# Patient Record
Sex: Female | Born: 1950 | Race: Black or African American | Hispanic: No | Marital: Married | Smoking: Current every day smoker
Health system: Southern US, Community
[De-identification: ages and names within clinical notes are randomized; demographics above are authoritative.]

## PROBLEM LIST (undated history)

## (undated) DIAGNOSIS — I4891 Unspecified atrial fibrillation: Secondary | ICD-10-CM

## (undated) DIAGNOSIS — I739 Peripheral vascular disease, unspecified: Secondary | ICD-10-CM

## (undated) DIAGNOSIS — I10 Essential (primary) hypertension: Secondary | ICD-10-CM

## (undated) HISTORY — PX: CARDIAC ELECTROPHYSIOLOGY STUDY AND ABLATION: SHX1294

## (undated) HISTORY — PX: REPLACEMENT TOTAL KNEE BILATERAL: SUR1225

## (undated) HISTORY — PX: ABDOMINAL HYSTERECTOMY: SHX81

---

## 2012-12-27 ENCOUNTER — Ambulatory Visit: Payer: Self-pay | Admitting: Family Medicine

## 2013-06-16 ENCOUNTER — Ambulatory Visit: Payer: Self-pay | Admitting: Pain Medicine

## 2013-07-04 ENCOUNTER — Ambulatory Visit: Payer: Self-pay | Admitting: Pain Medicine

## 2013-08-02 ENCOUNTER — Ambulatory Visit: Payer: Self-pay | Admitting: Pain Medicine

## 2013-08-22 ENCOUNTER — Ambulatory Visit: Payer: Self-pay | Admitting: Pain Medicine

## 2013-08-29 ENCOUNTER — Ambulatory Visit: Payer: Self-pay | Admitting: Pain Medicine

## 2013-09-26 ENCOUNTER — Ambulatory Visit: Payer: Self-pay | Admitting: Pain Medicine

## 2014-03-05 ENCOUNTER — Ambulatory Visit: Payer: Self-pay

## 2014-03-13 ENCOUNTER — Ambulatory Visit: Payer: Self-pay | Admitting: Family Medicine

## 2014-06-20 ENCOUNTER — Ambulatory Visit: Payer: Self-pay | Admitting: Family Medicine

## 2014-09-09 ENCOUNTER — Encounter: Payer: Self-pay | Admitting: Emergency Medicine

## 2014-09-09 ENCOUNTER — Emergency Department: Payer: Self-pay | Attending: Emergency Medicine

## 2014-09-09 ENCOUNTER — Inpatient Hospital Stay
Admission: EM | Admit: 2014-09-09 | Discharge: 2014-09-13 | DRG: 981 | Payer: Self-pay | Attending: Internal Medicine | Admitting: Internal Medicine

## 2014-09-09 DIAGNOSIS — Z01818 Encounter for other preprocedural examination: Secondary | ICD-10-CM

## 2014-09-09 DIAGNOSIS — R579 Shock, unspecified: Secondary | ICD-10-CM | POA: Diagnosis present

## 2014-09-09 DIAGNOSIS — I8501 Esophageal varices with bleeding: Secondary | ICD-10-CM | POA: Diagnosis present

## 2014-09-09 DIAGNOSIS — E669 Obesity, unspecified: Secondary | ICD-10-CM | POA: Diagnosis present

## 2014-09-09 DIAGNOSIS — K7682 Hepatic encephalopathy: Secondary | ICD-10-CM

## 2014-09-09 DIAGNOSIS — Z7951 Long term (current) use of inhaled steroids: Secondary | ICD-10-CM

## 2014-09-09 DIAGNOSIS — Z90710 Acquired absence of both cervix and uterus: Secondary | ICD-10-CM

## 2014-09-09 DIAGNOSIS — F17210 Nicotine dependence, cigarettes, uncomplicated: Secondary | ICD-10-CM | POA: Diagnosis present

## 2014-09-09 DIAGNOSIS — D649 Anemia, unspecified: Secondary | ICD-10-CM

## 2014-09-09 DIAGNOSIS — Z452 Encounter for adjustment and management of vascular access device: Secondary | ICD-10-CM

## 2014-09-09 DIAGNOSIS — R569 Unspecified convulsions: Secondary | ICD-10-CM

## 2014-09-09 DIAGNOSIS — Z885 Allergy status to narcotic agent status: Secondary | ICD-10-CM

## 2014-09-09 DIAGNOSIS — J9600 Acute respiratory failure, unspecified whether with hypoxia or hypercapnia: Secondary | ICD-10-CM

## 2014-09-09 DIAGNOSIS — Z7982 Long term (current) use of aspirin: Secondary | ICD-10-CM

## 2014-09-09 DIAGNOSIS — E7220 Disorder of urea cycle metabolism, unspecified: Secondary | ICD-10-CM | POA: Diagnosis present

## 2014-09-09 DIAGNOSIS — N170 Acute kidney failure with tubular necrosis: Secondary | ICD-10-CM | POA: Diagnosis present

## 2014-09-09 DIAGNOSIS — N179 Acute kidney failure, unspecified: Secondary | ICD-10-CM

## 2014-09-09 DIAGNOSIS — K7200 Acute and subacute hepatic failure without coma: Principal | ICD-10-CM | POA: Diagnosis present

## 2014-09-09 DIAGNOSIS — K922 Gastrointestinal hemorrhage, unspecified: Secondary | ICD-10-CM

## 2014-09-09 DIAGNOSIS — B1920 Unspecified viral hepatitis C without hepatic coma: Secondary | ICD-10-CM

## 2014-09-09 DIAGNOSIS — K7460 Unspecified cirrhosis of liver: Secondary | ICD-10-CM

## 2014-09-09 DIAGNOSIS — G40901 Epilepsy, unspecified, not intractable, with status epilepticus: Secondary | ICD-10-CM

## 2014-09-09 DIAGNOSIS — Z809 Family history of malignant neoplasm, unspecified: Secondary | ICD-10-CM

## 2014-09-09 DIAGNOSIS — I739 Peripheral vascular disease, unspecified: Secondary | ICD-10-CM | POA: Diagnosis present

## 2014-09-09 DIAGNOSIS — K767 Hepatorenal syndrome: Secondary | ICD-10-CM | POA: Diagnosis present

## 2014-09-09 DIAGNOSIS — Z79899 Other long term (current) drug therapy: Secondary | ICD-10-CM

## 2014-09-09 DIAGNOSIS — E875 Hyperkalemia: Secondary | ICD-10-CM | POA: Diagnosis present

## 2014-09-09 DIAGNOSIS — K920 Hematemesis: Secondary | ICD-10-CM | POA: Diagnosis present

## 2014-09-09 DIAGNOSIS — K7290 Hepatic failure, unspecified without coma: Secondary | ICD-10-CM

## 2014-09-09 DIAGNOSIS — Z96653 Presence of artificial knee joint, bilateral: Secondary | ICD-10-CM | POA: Diagnosis present

## 2014-09-09 DIAGNOSIS — K5900 Constipation, unspecified: Secondary | ICD-10-CM

## 2014-09-09 DIAGNOSIS — Z8249 Family history of ischemic heart disease and other diseases of the circulatory system: Secondary | ICD-10-CM

## 2014-09-09 DIAGNOSIS — I482 Chronic atrial fibrillation: Secondary | ICD-10-CM | POA: Diagnosis present

## 2014-09-09 HISTORY — DX: Essential (primary) hypertension: I10

## 2014-09-09 HISTORY — DX: Unspecified atrial fibrillation: I48.91

## 2014-09-09 HISTORY — DX: Peripheral vascular disease, unspecified: I73.9

## 2014-09-09 MED ORDER — SODIUM CHLORIDE 0.9 % IV SOLN
8.0000 mg | Freq: Once | INTRAVENOUS | Status: AC
  Administered 2014-09-09: 8 mg via INTRAVENOUS
  Filled 2014-09-09: qty 4

## 2014-09-09 MED ORDER — METOCLOPRAMIDE HCL 5 MG/ML IJ SOLN
20.0000 mg | Freq: Once | INTRAVENOUS | Status: AC
  Administered 2014-09-10: 20 mg via INTRAVENOUS
  Filled 2014-09-09: qty 4

## 2014-09-09 MED ORDER — OCTREOTIDE ACETATE 100 MCG/ML IJ SOLN
100.0000 ug | Freq: Once | INTRAMUSCULAR | Status: AC
  Administered 2014-09-09: 100 ug via INTRAVENOUS
  Filled 2014-09-09: qty 1

## 2014-09-09 MED ORDER — SODIUM CHLORIDE 0.9 % IV SOLN
80.0000 mg | Freq: Once | INTRAVENOUS | Status: AC
  Administered 2014-09-09: 80 mg via INTRAVENOUS
  Filled 2014-09-09: qty 80

## 2014-09-09 MED ORDER — SODIUM CHLORIDE 0.9 % IV BOLUS (SEPSIS)
2000.0000 mL | Freq: Once | INTRAVENOUS | Status: AC
  Administered 2014-09-09: 2000 mL via INTRAVENOUS

## 2014-09-09 NOTE — ED Notes (Signed)
Pt actively vomiting up dark red blood. MD aware.

## 2014-09-09 NOTE — ED Notes (Signed)
Pending results per lab.

## 2014-09-09 NOTE — ED Notes (Signed)
Pt arrived via EMS from home. Per EMS pt coughed up bright red blood 50-73ml. Pt appears diaphoretic and weak upon arrival;alert and oriented. Pt just returned from a cruise to the [place].

## 2014-09-09 NOTE — ED Provider Notes (Signed)
[facility] Emergency Department Provider Note  ____________________________________________  Time seen: 9:15 PM  I have reviewed the triage vital signs and the nursing notes.   HISTORY  Chief Complaint Hemoptysis and Weakness    HPI Robin Schneider is a 64 y.o. female who reports sudden onset of epigastric pain and hematemesis this afternoon. She recently returned from a cruise in the [place]. She denies prior GI bleeds or peptic ulcers. She has been taking some ibuprofen but not excessive or frequent doses. No syncope chest pain shortness of breath. No diarrhea or melena. No trauma, no anticoagulant use     Past Medical History  Diagnosis Date  . Hypertension     There are no active problems to display for this patient.   Past Surgical History  Procedure Laterality Date  . Replacement total knee bilateral    . Cardiac electrophysiology study and ablation      No current outpatient prescriptions on file.  Allergies Morphine and related and Percocet  History reviewed. No pertinent family history.  Social History History  Substance Use Topics  . Smoking status: Current Every Day Smoker -- 0.50 packs/day    Types: Cigarettes  . Smokeless tobacco: Not on file  . Alcohol Use: No    Review of Systems  Constitutional: No fever or chills. No weight changes Eyes:No blurry vision or double vision.  ENT: No sore throat. Cardiovascular: No chest pain. Respiratory: No dyspnea or cough. Gastrointestinal: Epigastric abdominal pain with hematemesis.  No BRBPR or melena. Genitourinary: Negative for dysuria, urinary retention, bloody urine, or difficulty urinating. Musculoskeletal: Negative for back pain. No joint swelling or pain. Skin: Negative for rash. Neurological: Negative for headaches, focal weakness or numbness. Psychiatric:No anxiety or depression.   Endocrine:No hot/cold intolerance, changes in energy, or sleep  difficulty.  10-point ROS otherwise negative.  ____________________________________________   PHYSICAL EXAM:  VITAL SIGNS: ED Triage Vitals  Enc Vitals Group     BP 09/09/14 2119 92/48 mmHg     Pulse Rate 09/09/14 2119 96     Resp 09/09/14 2119 24     Temp 09/09/14 2119 97.4 F (36.3 C)     Temp Source 09/09/14 2119 Oral     SpO2 09/09/14 2119 99 %     Weight --      Height 09/09/14 2119 5\' 6"  (1.676 m)     Head Cir --      Peak Flow --      Pain Score --      Pain Loc --      Pain Edu? --      Excl. in GC? --      Constitutional: Alert and oriented. Moderate distress Eyes: No scleral icterus. No conjunctival pallor. PERRL. EOMI ENT   Head: Normocephalic and atraumatic.   Nose: No congestion/rhinnorhea. No septal hematoma   Mouth/Throat: MMM, no pharyngeal erythema. No peritonsillar mass. No uvula shift. Dried blood on lips   Neck: No stridor. No SubQ emphysema. No meningismus. Hematological/Lymphatic/Immunilogical: No cervical lymphadenopathy. Cardiovascular: RRR. Normal and symmetric distal pulses are present in all extremities. No murmurs, rubs, or gallops. Respiratory: Normal respiratory effort without tachypnea nor retractions. Breath sounds are clear and equal bilaterally. No wheezes/rales/rhonchi. Gastrointestinal: Soft with epigastric and left upper quadrant tenderness. No distention. There is no CVA tenderness.  No rebound, rigidity, or guarding. Genitourinary: deferred Musculoskeletal: Nontender with normal range of motion in all extremities. No joint effusions.  No lower extremity tenderness.  No edema. Neurologic:  Normal speech and language.  CN 2-10 normal. Motor grossly intact. No pronator drift.  Normal gait. No gross focal neurologic deficits are appreciated.  Skin:  Skin is warm, dry and intact. No rash noted.  No petechiae, purpura, or bullae. Psychiatric: Mood and affect are normal. Speech and behavior are normal. Patient exhibits  appropriate insight and judgment.  ____________________________________________    LABS (pertinent positives/negatives) (all labs ordered are listed, but only abnormal results are displayed) Labs Reviewed  CBC  BASIC METABOLIC PANEL  HEPATIC FUNCTION PANEL  LIPASE, BLOOD  PROTIME-INR  CBG MONITORING, ED  TYPE AND SCREEN  ABO/RH   ____________________________________________   EKG  Interpreted by me Junctional rhythm rate 89, normal axis and intervals, normal QRS, normal ST segments and T waves.  ____________________________________________    RADIOLOGY  Chest x-ray unremarkable  ____________________________________________   PROCEDURES CRITICAL CARE Performed by: Scotty Court, Bhargav Barbaro   Total critical care time: 35 minutes  Critical care time was exclusive of separately billable procedures and treating other patients.  Critical care was necessary to treat or prevent imminent or life-threatening deterioration.  Critical care was time spent personally by me on the following activities: development of treatment plan with patient and/or surrogate as well as nursing, discussions with consultants, evaluation of patient's response to treatment, examination of patient, obtaining history from patient or surrogate, ordering and performing treatments and interventions, ordering and review of laboratory studies, ordering and review of radiographic studies, pulse oximetry and re-evaluation of patient's condition.  ____________________________________________   INITIAL IMPRESSION / ASSESSMENT AND PLAN / ED COURSE  Pertinent labs & imaging results that were available during my care of the patient were reviewed by me and considered in my medical decision making (see chart for details).  Patient presents with acute upper GI hemorrhage. We'll give 2 L IV saline for resuscitation as patient presented borderline hypotensive with initial blood pressure 90/50. Will start Protonix bolus  plus drip, octreotide despite absence of liver disease history because of the severity of her presentation, Zofran.  ----------------------------------------- 12:03 AM on 09/10/2014 -----------------------------------------  Still waiting on lab results despite multiple calls to the lab. No explanation for the delay. I discussed the patient's case with GI doctor oh who is no further recommendations at this time and will consult on the patient and requests admission to hospitalist services. I then discussed the case with hospitalist Dr. Sheryle Hail at 12:00 AM who will evaluate the patient for admission. With IV fluids, blood pressure has been stable at 100/65. Heart rate of 90.    ____________________________________________   FINAL CLINICAL IMPRESSION(S) / ED DIAGNOSES  Final diagnoses:  Acute upper GI hemorrhage      Sharman Cheek, MD 09/10/14 0003

## 2014-09-10 ENCOUNTER — Inpatient Hospital Stay: Payer: Self-pay | Admitting: Anesthesiology

## 2014-09-10 ENCOUNTER — Encounter: Payer: Self-pay | Admitting: Internal Medicine

## 2014-09-10 ENCOUNTER — Encounter
Admission: EM | Disposition: A | Discharge status: Other Acute Inpatient Hospital | Payer: Self-pay | Source: Home / Self Care | Attending: Internal Medicine

## 2014-09-10 DIAGNOSIS — K920 Hematemesis: Secondary | ICD-10-CM | POA: Diagnosis present

## 2014-09-10 HISTORY — PX: ESOPHAGEAL BANDING: SHX5518

## 2014-09-10 HISTORY — PX: ESOPHAGOGASTRODUODENOSCOPY: SHX5428

## 2014-09-10 LAB — HEPATIC FUNCTION PANEL
ALK PHOS: 105 U/L (ref 38–126)
ALT: 16 U/L (ref 14–54)
AST: 40 U/L (ref 15–41)
Albumin: 3 g/dL — ABNORMAL LOW (ref 3.5–5.0)
BILIRUBIN TOTAL: 1.1 mg/dL (ref 0.3–1.2)
Bilirubin, Direct: 0.3 mg/dL (ref 0.1–0.5)
Indirect Bilirubin: 0.8 mg/dL (ref 0.3–0.9)
Total Protein: 5.2 g/dL — ABNORMAL LOW (ref 6.5–8.1)

## 2014-09-10 LAB — HEMOGLOBIN: HEMOGLOBIN: 9.6 g/dL — AB (ref 12.0–16.0)

## 2014-09-10 LAB — BASIC METABOLIC PANEL
ANION GAP: 4 — AB (ref 5–15)
BUN: 26 mg/dL — AB (ref 6–20)
CO2: 21 mmol/L — AB (ref 22–32)
CREATININE: 0.98 mg/dL (ref 0.44–1.00)
Calcium: 7.9 mg/dL — ABNORMAL LOW (ref 8.9–10.3)
Chloride: 110 mmol/L (ref 101–111)
GFR calc Af Amer: >60 mL/min (ref >60)
GFR calc non Af Amer: >60 mL/min (ref >60)
Glucose, Bld: 203 mg/dL — ABNORMAL HIGH (ref 65–99)
Potassium: 5.5 mmol/L — ABNORMAL HIGH (ref 3.5–5.1)
Sodium: 135 mmol/L (ref 135–145)

## 2014-09-10 LAB — CBC
HCT: 32.4 % — ABNORMAL LOW (ref 35.0–47.0)
Hemoglobin: 10.1 g/dL — ABNORMAL LOW (ref 12.0–16.0)
MCH: 23.9 pg — AB (ref 26.0–34.0)
MCHC: 31.2 g/dL — ABNORMAL LOW (ref 32.0–36.0)
MCV: 76.4 fL — ABNORMAL LOW (ref 80.0–100.0)
Platelets: 120 10*3/uL — ABNORMAL LOW (ref 150–440)
RBC: 4.24 MIL/uL (ref 3.80–5.20)
RDW: 16.9 % — ABNORMAL HIGH (ref 11.5–14.5)
WBC: 7.8 10*3/uL (ref 3.6–11.0)

## 2014-09-10 LAB — MRSA PCR SCREENING: MRSA by PCR: NEGATIVE

## 2014-09-10 LAB — PROTIME-INR
INR: 1.64
PROTHROMBIN TIME: 19.6 s — AB (ref 11.4–15.0)

## 2014-09-10 LAB — TYPE AND SCREEN
ABO/RH(D): A POS
Antibody Screen: POSITIVE

## 2014-09-10 LAB — TSH: TSH: 1.101 u[IU]/mL (ref 0.350–4.500)

## 2014-09-10 LAB — LACTIC ACID, PLASMA
Lactic Acid, Venous: 1.6 mmol/L (ref 0.5–2.0)
Lactic Acid, Venous: 1.9 mmol/L (ref 0.5–2.0)

## 2014-09-10 LAB — ABO/RH: ABO/RH(D): A POS

## 2014-09-10 LAB — LIPASE, BLOOD: Lipase: 34 U/L (ref 22–51)

## 2014-09-10 LAB — GLUCOSE, CAPILLARY: Glucose-Capillary: 221 mg/dL — ABNORMAL HIGH (ref 65–99)

## 2014-09-10 LAB — HEMOGLOBIN A1C: Hgb A1c MFr Bld: 5.6 % (ref 4.0–6.0)

## 2014-09-10 SURGERY — EGD (ESOPHAGOGASTRODUODENOSCOPY)
Anesthesia: General

## 2014-09-10 SURGERY — EGD (ESOPHAGOGASTRODUODENOSCOPY)
Anesthesia: General | Laterality: Left

## 2014-09-10 MED ORDER — PREGABALIN 75 MG PO CAPS: 75.0000 mg | ORAL_CAPSULE | Freq: Two times a day (BID) | ORAL | Status: DC

## 2014-09-10 MED ORDER — SODIUM CHLORIDE 0.9 % IV SOLN
25.0000 ug/h | INTRAVENOUS | Status: DC
  Administered 2014-09-10 (×2): 50 ug/h via INTRAVENOUS
  Administered 2014-09-11 – 2014-09-13 (×2): 25 ug/h via INTRAVENOUS
  Filled 2014-09-10 (×14): qty 1

## 2014-09-10 MED ORDER — FENTANYL CITRATE (PF) 100 MCG/2ML IJ SOLN
INTRAMUSCULAR | Status: DC | PRN
  Administered 2014-09-10: 50 ug via INTRAVENOUS

## 2014-09-10 MED ORDER — DIAZEPAM 2 MG PO TABS: 5.0000 mg | ORAL_TABLET | Freq: Two times a day (BID) | ORAL | Status: DC | PRN

## 2014-09-10 MED ORDER — ALBUTEROL SULFATE (2.5 MG/3ML) 0.083% IN NEBU: 3.0000 mL | INHALATION_SOLUTION | RESPIRATORY_TRACT | Status: DC | PRN

## 2014-09-10 MED ORDER — PHENYLEPHRINE HCL 10 MG/ML IJ SOLN
INTRAMUSCULAR | Status: DC | PRN
  Administered 2014-09-10: 300 ug via INTRAVENOUS
  Administered 2014-09-10: 200 ug via INTRAVENOUS
  Administered 2014-09-10: 100 ug via INTRAVENOUS
  Administered 2014-09-10 (×2): 200 ug via INTRAVENOUS

## 2014-09-10 MED ORDER — ONDANSETRON HCL 4 MG PO TABS: 4.0000 mg | ORAL_TABLET | Freq: Four times a day (QID) | ORAL | Status: DC | PRN

## 2014-09-10 MED ORDER — LIDOCAINE HCL (PF) 2 % IJ SOLN
INTRAMUSCULAR | Status: DC | PRN
  Administered 2014-09-10: 2 mL via INTRADERMAL

## 2014-09-10 MED ORDER — DIAZEPAM 5 MG/ML IJ SOLN
2.5000 mg | Freq: Once | INTRAMUSCULAR | Status: AC
  Administered 2014-09-10: 2.5 mg via INTRAVENOUS

## 2014-09-10 MED ORDER — ALBUTEROL SULFATE 1.25 MG/3ML IN NEBU: 3.0000 mL | INHALATION_SOLUTION | Freq: Four times a day (QID) | RESPIRATORY_TRACT | Status: DC | PRN

## 2014-09-10 MED ORDER — ONDANSETRON HCL 4 MG/2ML IJ SOLN
4.0000 mg | Freq: Four times a day (QID) | INTRAMUSCULAR | Status: AC
  Administered 2014-09-10 – 2014-09-11 (×2): 4 mg via INTRAVENOUS
  Filled 2014-09-10 (×2): qty 2

## 2014-09-10 MED ORDER — PROPOFOL INFUSION 10 MG/ML OPTIME
INTRAVENOUS | Status: DC | PRN
  Administered 2014-09-10: 120 ug/kg/min via INTRAVENOUS

## 2014-09-10 MED ORDER — SODIUM CHLORIDE 0.9 % IV SOLN: INTRAVENOUS | Status: DC

## 2014-09-10 MED ORDER — SODIUM CHLORIDE 0.9 % IJ SOLN
3.0000 mL | Freq: Two times a day (BID) | INTRAMUSCULAR | Status: DC
  Administered 2014-09-10 – 2014-09-12 (×4): 3 mL via INTRAVENOUS

## 2014-09-10 MED ORDER — DIAZEPAM 5 MG/ML IJ SOLN
INTRAMUSCULAR | Status: AC
Start: 2014-09-10 — End: 2014-09-10
  Administered 2014-09-10: 2.5 mg via INTRAVENOUS
  Filled 2014-09-10: qty 2

## 2014-09-10 MED ORDER — HEPARIN SODIUM (PORCINE) 5000 UNIT/ML IJ SOLN
5000.0000 [IU] | Freq: Three times a day (TID) | INTRAMUSCULAR | Status: DC
  Administered 2014-09-10: 5000 [IU] via SUBCUTANEOUS
  Filled 2014-09-10: qty 1

## 2014-09-10 MED ORDER — EPHEDRINE SULFATE 50 MG/ML IJ SOLN
INTRAMUSCULAR | Status: DC | PRN
  Administered 2014-09-10 (×3): 15 mg via INTRAVENOUS
  Administered 2014-09-10: 5 mg via INTRAVENOUS

## 2014-09-10 MED ORDER — OLOPATADINE HCL 0.1 % OP SOLN
1.0000 [drp] | Freq: Two times a day (BID) | OPHTHALMIC | Status: DC
  Administered 2014-09-10 – 2014-09-13 (×6): 1 [drp] via OPHTHALMIC
  Filled 2014-09-10 (×2): qty 5

## 2014-09-10 MED ORDER — PROPOFOL 10 MG/ML IV BOLUS
INTRAVENOUS | Status: DC | PRN
  Administered 2014-09-10: 20 mg via INTRAVENOUS

## 2014-09-10 MED ORDER — SODIUM CHLORIDE 0.9 % IV SOLN
INTRAVENOUS | Status: DC | PRN
  Administered 2014-09-10: 09:00:00 via INTRAVENOUS

## 2014-09-10 MED ORDER — LACTATED RINGERS IV SOLN
INTRAVENOUS | Status: DC | PRN
  Administered 2014-09-10: 09:00:00 via INTRAVENOUS

## 2014-09-10 MED ORDER — NORTRIPTYLINE HCL 25 MG PO CAPS: 25.0000 mg | ORAL_CAPSULE | Freq: Every day | ORAL | Status: DC

## 2014-09-10 MED ORDER — SPIRONOLACTONE 25 MG PO TABS: 50.0000 mg | ORAL_TABLET | Freq: Every day | ORAL | Status: DC

## 2014-09-10 MED ORDER — ONDANSETRON HCL 4 MG/2ML IJ SOLN
4.0000 mg | Freq: Four times a day (QID) | INTRAMUSCULAR | Status: DC | PRN
  Administered 2014-09-10: 4 mg via INTRAVENOUS
  Filled 2014-09-10 (×2): qty 2

## 2014-09-10 MED ORDER — SODIUM CHLORIDE 0.9 % IV SOLN
8.0000 mg/h | INTRAVENOUS | Status: DC
  Administered 2014-09-10: 8 mg/h via INTRAVENOUS
  Filled 2014-09-10: qty 80

## 2014-09-10 MED ORDER — PANTOPRAZOLE SODIUM 40 MG IV SOLR
40.0000 mg | Freq: Two times a day (BID) | INTRAVENOUS | Status: DC
  Administered 2014-09-10 – 2014-09-13 (×7): 40 mg via INTRAVENOUS
  Filled 2014-09-10 (×8): qty 40

## 2014-09-10 MED ORDER — SODIUM CHLORIDE 0.9 % IV BOLUS (SEPSIS)
250.0000 mL | Freq: Once | INTRAVENOUS | Status: AC
Start: 2014-09-10 — End: 2014-09-10
  Administered 2014-09-10: 250 mL via INTRAVENOUS

## 2014-09-10 MED ORDER — SODIUM CHLORIDE 0.9 % IV SOLN
INTRAVENOUS | Status: DC
  Administered 2014-09-10: 1000 mL via INTRAVENOUS
  Administered 2014-09-10: 125 mL/h via INTRAVENOUS
  Administered 2014-09-10 – 2014-09-11 (×2): via INTRAVENOUS

## 2014-09-10 MED ORDER — ASPIRIN EC 81 MG PO TBEC: 81.0000 mg | DELAYED_RELEASE_TABLET | Freq: Every day | ORAL | Status: DC

## 2014-09-10 MED ORDER — MIDAZOLAM HCL 5 MG/5ML IJ SOLN
INTRAMUSCULAR | Status: DC | PRN
  Administered 2014-09-10: 1 mg via INTRAVENOUS

## 2014-09-10 MED ORDER — NADOLOL 20 MG PO TABS
20.0000 mg | ORAL_TABLET | Freq: Every day | ORAL | Status: DC
  Filled 2014-09-10: qty 1

## 2014-09-10 MED ORDER — DIAZEPAM 5 MG PO TABS: 10.0000 mg | ORAL_TABLET | Freq: Two times a day (BID) | ORAL | Status: DC | PRN

## 2014-09-10 NOTE — Consult Note (Signed)
GI Inpatient Consult Note  Reason for Consult: Hemetemesis   Attending Requesting Consult:  History of Present Illness: Robin Schneider is a 64 y.o. female who felt sick last night and ended up vomiting dark blood multiple times. Was initially hypotensive but with IVF, SBP more stable this AM. Some epigastric pain/nausea but feels better this AM. No prior problems with stomach. No GERD hx. On daily b ASA for chronic Afib. Also, takes naproxen prn. On protonix and octreotide drip for now. INR mildly elevated.  Past Medical History:  Past Medical History  Diagnosis Date  . Hypertension   . A-fib   . PAD (peripheral artery disease)     Problem List: Patient Active Problem List   Diagnosis Date Noted  . Hematemesis 09/10/2014    Past Surgical History: Past Surgical History  Procedure Laterality Date  . Replacement total knee bilateral    . Cardiac electrophysiology study and ablation      Alblation  . Abdominal hysterectomy      Allergies: Allergies  Allergen Reactions  . Morphine And Related Rash  . Percocet [Oxycodone-Acetaminophen] Rash    Home Medications: Prescriptions prior to admission  Medication Sig Dispense Refill Last Dose  . albuterol (ACCUNEB) 1.25 MG/3ML nebulizer solution Inhale 3 mLs into the lungs every 6 (six) hours as needed for wheezing or shortness of breath.    Past Week at Unknown time  . albuterol (VENTOLIN HFA) 108 (90 BASE) MCG/ACT inhaler Inhale 2 puffs into the lungs every 4 (four) hours as needed for wheezing or shortness of breath.    Past Week at Unknown time  . aspirin EC 81 MG tablet Take 1 tablet by mouth daily.    09/10/2014 at Unknown time  . diazepam (VALIUM) 5 MG tablet Take 10 mg by mouth every 12 (twelve) hours as needed for anxiety.    unknown at unknown  . diclofenac sodium (VOLTAREN) 1 % GEL Apply 2 g topically 2 (two) times daily.   09/10/2014 at Unknown time  . nortriptyline (PAMELOR) 25 MG capsule Take 25 mg by mouth at  bedtime.   unknown at Unknown time  . olopatadine (PATANOL) 0.1 % ophthalmic solution Apply 1 drop to eye 2 (two) times daily.   09/10/2014 at Unknown time  . pregabalin (LYRICA) 75 MG capsule Take 1 capsule by mouth 2 (two) times daily.   09/10/2014 at Unknown time  . spironolactone (ALDACTONE) 50 MG tablet Take 1 tablet by mouth daily at 12 noon.   09/10/2014   Home medication reconciliation was completed with the patient.   Scheduled Inpatient Medications:   . heparin  5,000 Units Subcutaneous 3 times per day  . nortriptyline  25 mg Oral QHS  . olopatadine  1 drop Both Eyes BID  . pregabalin  75 mg Oral BID  . sodium chloride  3 mL Intravenous Q12H    Continuous Inpatient Infusions:   . sodium chloride 125 mL/hr (09/10/14 0409)  . pantoprozole (PROTONIX) infusion 8 mg/hr (09/10/14 0300)    PRN Inpatient Medications:  albuterol, diazepam, ondansetron **OR** ondansetron (ZOFRAN) IV  Family History: family history includes Cancer in an other family member; Hypertension in an other family member.  The patient's family history is negative for inflammatory bowel disorders, GI malignancy, or solid organ transplantation.  Social History:   reports that she has been smoking Cigarettes.  She has a 24 pack-year smoking history. She does not have any smokeless tobacco history on file. She reports that she does not  drink alcohol or use illicit drugs. The patient denies ETOH, tobacco, or drug use.   Review of Systems: Constitutional: Weight is stable.  Eyes: No changes in vision. ENT: No oral lesions, sore throat.  GI: see HPI.  Heme/Lymph: No easy bruising.  CV: No chest pain.  GU: No hematuria.  Integumentary: No rashes.  Neuro: No headaches.  Psych: No depression/anxiety.  Endocrine: No heat/cold intolerance.  Allergic/Immunologic: No urticaria.  Resp: No cough, SOB.  Musculoskeletal: No joint swelling.    Physical Examination: BP 102/62 mmHg  Pulse 81  Temp(Src) 98.3 F (36.8  C) (Oral)  Resp 13  Ht 5\' 7"  (1.702 m)  Wt 81.7 kg (180 lb 1.9 oz)  BMI 28.20 kg/m2  SpO2 94% Gen: NAD, alert and oriented x 4 HEENT: PEERLA, EOMI, Neck: supple, no JVD or thyromegaly Chest: CTA bilaterally, no wheezes, crackles, or other adventitious sounds CV: RRR, no m/g/c/r Abd: soft, mild epigastric tenderness, ND, +BS in all four quadrants; no HSM, guarding, ridigity, or rebound tenderness Ext: no edema, well perfused with 2+ pulses, Skin: no rash or lesions noted Lymph: no LAD  Data: Lab Results  Component Value Date   WBC 7.8 09/09/2014   HGB 10.1* 09/09/2014   HCT 32.4* 09/09/2014   MCV 76.4* 09/09/2014   PLT 120* 09/09/2014    Recent Labs Lab 09/09/14 2201  HGB 10.1*   Lab Results  Component Value Date   NA 135 09/09/2014   K 5.5* 09/09/2014   CL 110 09/09/2014   CO2 21* 09/09/2014   BUN 26* 09/09/2014   CREATININE 0.98 09/09/2014   Lab Results  Component Value Date   ALT 16 09/09/2014   AST 40 09/09/2014   ALKPHOS 105 09/09/2014   BILITOT 1.1 09/09/2014    Recent Labs Lab 09/09/14 2210  INR 1.64   Assessment/Plan: [name] is a 64 y.o. female with UGI bleeding, exacerbated by bASA/naproxen use.   Recommendations: Continue NPO for now. Moniter CBC. Continue protonix drip. Plan EGD this AM. Thank you for the consult. Please call with questions or concerns.  Tyjai Matuszak, Ezzard Standing, MD

## 2014-09-10 NOTE — Anesthesia Preprocedure Evaluation (Addendum)
Anesthesia Evaluation  Patient identified by MRN, date of birth, ID band Patient awake    Reviewed: Allergy & Precautions, NPO status , Patient's Chart, lab work & pertinent test results  History of Anesthesia Complications Negative for: history of anesthetic complications  Airway Mallampati: II  TM Distance: >3 FB Neck ROM: Full    Dental  (+) Poor Dentition, Upper Dentures, Partial Lower   Pulmonary COPD COPD inhaler, Current Smoker (1/2 ppd),          Cardiovascular hypertension, Pt. on medications + Peripheral Vascular Disease + dysrhythmias (Hx pf Afib, S/P Ablation)     Neuro/Psych Seizures - (last 30 yrs ago), Well Controlled,  Anxiety Depression    GI/Hepatic   Endo/Other    Renal/GU      Musculoskeletal   Abdominal   Peds  Hematology  (+) anemia ,   Anesthesia Other Findings   Reproductive/Obstetrics                            Anesthesia Physical Anesthesia Plan  ASA: III  Anesthesia Plan: General   Post-op Pain Management:    Induction: Intravenous  Airway Management Planned: Nasal Cannula  Additional Equipment:   Intra-op Plan:   Post-operative Plan:   Informed Consent: I have reviewed the patients History and Physical, chart, labs and discussed the procedure including the risks, benefits and alternatives for the proposed anesthesia with the patient or authorized representative who has indicated his/her understanding and acceptance.     Plan Discussed with:   Anesthesia Plan Comments:         Anesthesia Quick Evaluation

## 2014-09-10 NOTE — Anesthesia Postprocedure Evaluation (Signed)
  Anesthesia Post-op Note  Patient: Robin Schneider  Procedure(s) Performed: Procedure(s): ESOPHAGOGASTRODUODENOSCOPY (EGD) (Left)  Anesthesia type:General  Patient location: PACU  Post pain: Pain level controlled  Post assessment: Post-op Vital signs reviewed, Patient's Cardiovascular Status Stable, Respiratory Function Stable, Patent Airway and No signs of Nausea or vomiting  Post vital signs: Reviewed and stable  Last Vitals:  Filed Vitals:   09/10/14 0742  BP: 102/62  Pulse:   Temp: 36.8 C  Resp:     Level of consciousness: awake, alert  and patient cooperative  Complications: No apparent anesthesia complications

## 2014-09-10 NOTE — Progress Notes (Signed)
-   Since EGD report is acknowledged. -She does have variceal bleed it looks like, appreciate GI consult. 6 bands have been placed. -No evidence of any gastritis or gastric ulcers. -So will start on octreotide drip and change the Protonix to IV twice a day at this time. -Blood pressure dropped after the procedure could be from sedation. Give her 500 cc boluses. Hold Nadolol at this time. -Continue to watch for in ICU until she is stable. -Continues to have vomiting, change Zofran to scheduled for one day. Hold Her oral medications.

## 2014-09-10 NOTE — ED Notes (Signed)
Pharmacy notified to send Reglan

## 2014-09-10 NOTE — Progress Notes (Signed)
Ed unable to obtain iv access for ct scan, spoke with Dr. Sheryle Hail he is aware of this and scan is not stat or emergent and can be done tomorrow per MD.

## 2014-09-10 NOTE — ED Notes (Signed)
Dr. Sheryle Hail aware patient is refusing to be stuck with 20G IV for CT.  Ok to send to floor.

## 2014-09-10 NOTE — Transfer of Care (Signed)
Immediate Anesthesia Transfer of Care Note  Patient: Robin Schneider  Procedure(s) Performed: Procedure(s): ESOPHAGOGASTRODUODENOSCOPY (EGD) (Left)  Patient Location: PACU  Anesthesia Type:General  Level of Consciousness: sedated  Airway & Oxygen Therapy: Patient Spontanous Breathing and Patient connected to nasal cannula oxygen  Post-op Assessment: Report given to RN and Post -op Vital signs reviewed and stable  Post vital signs: Reviewed and stable  Last Vitals:  Filed Vitals:   09/10/14 0742  BP: 102/62  Pulse:   Temp: 36.8 C  Resp:     Complications: No apparent anesthesia complications

## 2014-09-10 NOTE — OR Nursing (Signed)
Vital signs were taken and recorded on ekg strip because the patient was not on bed-5's monitor so it did not transfer over.

## 2014-09-10 NOTE — Progress Notes (Signed)
[facility] [place] at [facility]   PATIENT NAME: Robin Schneider    MR#:  [id number]  DATE OF BIRTH:  08-01-1950  SUBJECTIVE:  CHIEF COMPLAINT:   Chief Complaint  Patient presents with  . Hemoptysis  . Weakness   - admitted for hematemesis after eating seafood, no odynophagia. - No further bleeding, no abd pain or rectal bleed - on asa at home, on protonix drip now - GI consulted - Repeat Hb pending  REVIEW OF SYSTEMS:  Review of Systems  Constitutional: Negative for fever and chills.  Respiratory: Negative for cough, shortness of breath and wheezing.   Cardiovascular: Negative for chest pain and palpitations.  Gastrointestinal: Negative for nausea, vomiting, abdominal pain, diarrhea and constipation.       Hematemesis  Genitourinary: Negative for dysuria.  Neurological: Negative for dizziness, seizures and headaches.    DRUG ALLERGIES:   Allergies  Allergen Reactions  . Morphine And Related Rash  . Percocet [Oxycodone-Acetaminophen] Rash    VITALS:  Blood pressure 101/59, pulse 81, temperature 97.7 F (36.5 C), temperature source Oral, resp. rate 13, height 5\' 7"  (1.702 m), weight 81.7 kg (180 lb 1.9 oz), SpO2 94 %.  PHYSICAL EXAMINATION:  Physical Exam  GENERAL:  64 y.o.-year-old patient lying in the bed with no acute distress.  EYES: Pupils equal, round, reactive to light and accommodation. No scleral icterus. Extraocular muscles intact.  HEENT: Head atraumatic, normocephalic. Oropharynx and nasopharynx clear.  NECK:  Supple, no jugular venous distention. No thyroid enlargement, no tenderness.  LUNGS: Normal breath sounds bilaterally, no wheezing, rales,rhonchi or crepitation. No use of accessory muscles of respiration.  CARDIOVASCULAR: S1, S2 normal. No murmurs, rubs, or gallops.  ABDOMEN: Soft, nontender, nondistended. Bowel sounds present. No organomegaly or mass.  Some discomfort in LUQ on palpation EXTREMITIES: No pedal  edema, cyanosis, or clubbing.  NEUROLOGIC: Cranial nerves II through XII are intact. Muscle strength 5/5 in all extremities. Sensation intact. Gait not checked.  PSYCHIATRIC: The patient is alert and oriented x 3.  SKIN: No obvious rash, lesion, or ulcer.    LABORATORY PANEL:   CBC  Recent Labs Lab 09/09/14 2201  WBC 7.8  HGB 10.1*  HCT 32.4*  PLT 120*   ------------------------------------------------------------------------------------------------------------------  Chemistries   Recent Labs Lab 09/09/14 2201 09/09/14 2210  NA 135  --   K 5.5*  --   CL 110  --   CO2 21*  --   GLUCOSE 203*  --   BUN 26*  --   CREATININE 0.98  --   CALCIUM 7.9*  --   AST  --  40  ALT  --  16  ALKPHOS  --  105  BILITOT  --  1.1   ------------------------------------------------------------------------------------------------------------------  Cardiac Enzymes No results for input(s): TROPONINI in the last 168 hours. ------------------------------------------------------------------------------------------------------------------  RADIOLOGY:  Dg Chest Port 1 View  09/09/2014   CLINICAL DATA:  64 year old female coughing up blood this evening. Weakness. Initial encounter.  EXAM: PORTABLE CHEST - 1 VIEW  COMPARISON:  03/05/2014.  FINDINGS: Portable AP upright view at 2210 hrs. Stable cardiomegaly and mediastinal contours. Stable lung volumes. Chronic scarring in the lingula and along the minor fissure. No pneumothorax, pulmonary edema, pleural effusion or acute pulmonary opacity.  IMPRESSION: Stable cardiomegaly and pulmonary scarring. No acute cardiopulmonary abnormality.   Electronically Signed   By: Odessa Fleming M.D.   On: 09/09/2014 22:31    EKG:   Orders placed or performed during the hospital encounter  of 09/09/14  . ED EKG  . ED EKG    ASSESSMENT AND PLAN:   64y/o AAF with PMH of Hep C, Afib on asa, HTN, PAD admitted for Hematemesis.  * GI bleed- likely Upper GI - No  abdominal pain, discontinue CT angio at this point - Repeat Hb now, some drop expected with dilution - GI consulted - remains NPO- possible EGD today or tomorrow- If EGD is tomorrow- will start clears today - cont protonix drip - received octreotide once-no h/o varices- but known liver disease- monitor - BP is stable and no active bleeding - can be transferred to regular med/surg floor  * HTN- BP stable, hold aldactone  * Hyperkalemia- likely from aldactone, discontinue at this time  * Afib and junctional rhythm- known chronic history, hold asa with GI bleed - Rate is controlled, not on any meds  * DVT prophylaxis- cont SQ heparin for now    All the records are reviewed and case discussed with Care Management/Social Workerr. Management plans discussed with the patient, family and they are in agreement.  CODE STATUS: FULL CODE  TOTAL TIME TAKING CARE OF THIS PATIENT: 38 minutes.   POSSIBLE D/C IN 1-2 DAYS, DEPENDING ON CLINICAL CONDITION.   Enid Baas M.D on 09/10/2014 at 7:47 AM  Between 7am to 6pm - Pager - [phone]  After 6pm go to [email or web address] - password [email or web address] [facility]  [facility]  Office  [phone]  CC: Primary care physician; Leim Fabry, MD

## 2014-09-10 NOTE — Anesthesia Procedure Notes (Addendum)
Date/Time: 09/10/2014 9:11 AM Performed by: Derinda Late Pre-anesthesia Checklist: Timeout performed, Patient being monitored, Suction available, Emergency Drugs available and Patient identified Patient Re-evaluated:Patient Re-evaluated prior to inductionOxygen Delivery Method: Nasal cannula Intubation Type: IV induction   Performed by: Derinda Late Pre-anesthesia Checklist: Timeout performed, Patient being monitored, Suction available, Emergency Drugs available and Patient identified Oxygen Delivery Method: Nasal cannula Intubation Type: IV induction

## 2014-09-10 NOTE — H&P (Signed)
Robin Schneider is an 64 y.o. female.   Chief Complaint: Hematemesis HPI: The patient presents to the emergency department complaining of vomiting bright red blood. The patient was feeling well until this evening approximately 2 hours prior to admission when she began vomiting blood. She stated that she felt hungry and ate one bite of a fish sandwich after which she promptly threw up. She states that she barely swallowed a bite of food before she vomited. She denies odynophagia and denies abdominal pain except for mild tenderness that began on the left and has now moved to the right side of her abdomen. The patient reports that she recently returned from the [place] 3 weeks ago but she denies drinking any alcohol. She had been constipated for approximately 2 weeks but had a good bowel movement after using magnesium citrate and Dulcolax. Since that time she has been eating well but she remains constipated and has bowel movements that are hard and pellet-like. In the emergency department the patient received fluid boluses as well as anti-emetics and octreotide. The emergency department staff also gave her a bolus of Nexium prior to calling for admission.  Past Medical History  Diagnosis Date  . Hypertension   . A-fib   . PAD (peripheral artery disease)     Past Surgical History  Procedure Laterality Date  . Replacement total knee bilateral    . Cardiac electrophysiology study and ablation      Alblation  . Abdominal hysterectomy      Family History  Problem Relation Age of Onset  . Cancer      various types; throughout the family  . Hypertension      multiple family members   Social History:  reports that she has been smoking Cigarettes.  She has a 24 pack-year smoking history. She does not have any smokeless tobacco history on file. She reports that she does not drink alcohol or use illicit drugs.  Allergies:  Allergies  Allergen Reactions  . Morphine And Related Rash  .  Percocet [Oxycodone-Acetaminophen] Rash    Prior to Admission medications   Medication Sig Start Date End Date Taking? Authorizing Provider  albuterol (ACCUNEB) 1.25 MG/3ML nebulizer solution Inhale 3 mLs into the lungs every 6 (six) hours as needed for wheezing or shortness of breath.    Yes Historical Provider, MD  albuterol (VENTOLIN HFA) 108 (90 BASE) MCG/ACT inhaler Inhale 2 puffs into the lungs every 4 (four) hours as needed for wheezing or shortness of breath.  02/04/10  Yes Historical Provider, MD  aspirin EC 81 MG tablet Take 1 tablet by mouth daily.    Yes Historical Provider, MD  diazepam (VALIUM) 5 MG tablet Take 10 mg by mouth every 12 (twelve) hours as needed for anxiety.  10/12/12  Yes Historical Provider, MD  diclofenac sodium (VOLTAREN) 1 % GEL Apply 2 g topically 2 (two) times daily. 07/25/14 07/25/15 Yes Historical Provider, MD  nortriptyline (PAMELOR) 25 MG capsule Take 25 mg by mouth at bedtime.   Yes Historical Provider, MD  olopatadine (PATANOL) 0.1 % ophthalmic solution Apply 1 drop to eye 2 (two) times daily. 06/19/14  Yes Historical Provider, MD  pregabalin (LYRICA) 75 MG capsule Take 1 capsule by mouth 2 (two) times daily. 07/12/14 07/12/15 Yes Historical Provider, MD  spironolactone (ALDACTONE) 50 MG tablet Take 1 tablet by mouth daily at 12 noon. 08/19/13  Yes Historical Provider, MD     Results for orders placed or performed during the hospital encounter of  09/09/14 (from the past 48 hour(s))  CBC     Status: Abnormal   Collection Time: 09/09/14 10:01 PM  Result Value Ref Range   WBC 7.8 3.6 - 11.0 K/uL   RBC 4.24 3.80 - 5.20 MIL/uL   Hemoglobin 10.1 (L) 12.0 - 16.0 g/dL   HCT 32.4 (L) 35.0 - 47.0 %   MCV 76.4 (L) 80.0 - 100.0 fL   MCH 23.9 (L) 26.0 - 34.0 pg   MCHC 31.2 (L) 32.0 - 36.0 g/dL   RDW 16.9 (H) 11.5 - 14.5 %   Platelets 120 (L) 150 - 440 K/uL  Basic metabolic panel     Status: Abnormal   Collection Time: 09/09/14 10:01 PM  Result Value Ref Range    Sodium 135 135 - 145 mmol/L   Potassium 5.5 (H) 3.5 - 5.1 mmol/L    Comment: HEMOLYSIS AT THIS LEVEL MAY AFFECT RESULT   Chloride 110 101 - 111 mmol/L   CO2 21 (L) 22 - 32 mmol/L   Glucose, Bld 203 (H) 65 - 99 mg/dL   BUN 26 (H) 6 - 20 mg/dL   Creatinine, Ser 0.98 0.44 - 1.00 mg/dL   Calcium 7.9 (L) 8.9 - 10.3 mg/dL   GFR calc non Af Amer >60 >60 mL/min   GFR calc Af Amer >60 >60 mL/min    Comment: (NOTE) The eGFR has been calculated using the CKD EPI equation. This calculation has not been validated in all clinical situations. eGFR's persistently <60 mL/min signify possible Chronic Kidney Disease.    Anion gap 4 (L) 5 - 15  Type and screen     Status: None (Preliminary result)   Collection Time: 09/09/14 10:01 PM  Result Value Ref Range   ABO/RH(D) PENDING    Antibody Screen PENDING    Sample Expiration 09/12/2014    Dg Chest Port 1 View  09/09/2014   CLINICAL DATA:  64 year old female coughing up blood this evening. Weakness. Initial encounter.  EXAM: PORTABLE CHEST - 1 VIEW  COMPARISON:  03/05/2014.  FINDINGS: Portable AP upright view at 2210 hrs. Stable cardiomegaly and mediastinal contours. Stable lung volumes. Chronic scarring in the lingula and along the minor fissure. No pneumothorax, pulmonary edema, pleural effusion or acute pulmonary opacity.  IMPRESSION: Stable cardiomegaly and pulmonary scarring. No acute cardiopulmonary abnormality.   Electronically Signed   By: Genevie Ann M.D.   On: 09/09/2014 22:31    Review of Systems  Constitutional: Negative for fever and chills.  HENT: Negative for sore throat and tinnitus.   Eyes: Negative for blurred vision and redness.  Respiratory: Negative for cough and shortness of breath.   Cardiovascular: Negative for chest pain, palpitations, orthopnea and PND.  Gastrointestinal: Positive for vomiting and constipation. Negative for nausea, abdominal pain, diarrhea, blood in stool and melena.  Genitourinary: Negative for dysuria, urgency  and frequency.  Musculoskeletal: Negative for myalgias and joint pain.  Skin: Negative for rash.       No lesions  Neurological: Negative for speech change, focal weakness and weakness.  Endo/Heme/Allergies: Does not bruise/bleed easily.       No temperature intolerance  Psychiatric/Behavioral: Negative for depression and suicidal ideas.    Blood pressure 100/59, pulse 84, temperature 97.4 F (36.3 C), temperature source Oral, resp. rate 12, height $RemoveBe'5\' 6"'piTiubknr$  (1.676 m), SpO2 96 %. Physical Exam  Vitals reviewed. Constitutional: She is oriented to person, place, and time. She appears well-developed and well-nourished.  HENT:  Head: Normocephalic and atraumatic.  Mouth/Throat: Oropharynx  is clear and moist.  Eyes: Conjunctivae and EOM are normal. Pupils are equal, round, and reactive to light. No scleral icterus.  Neck: Normal range of motion. No JVD present. No tracheal deviation present. No thyromegaly present.  Cardiovascular: Normal rate, regular rhythm, normal heart sounds and intact distal pulses.  Exam reveals no gallop and no friction rub.   No murmur heard. Respiratory: Effort normal and breath sounds normal.  GI: Soft. Bowel sounds are normal. She exhibits distension. She exhibits no mass. There is tenderness (Mild). There is no rebound and no guarding.  Genitourinary:  Deferred  Musculoskeletal: Normal range of motion. She exhibits no edema or tenderness.  Lymphadenopathy:    She has no cervical adenopathy.  Neurological: She is alert and oriented to person, place, and time. No cranial nerve deficit. She exhibits normal muscle tone.  Skin: Skin is warm and dry. No rash noted. No erythema.  Psychiatric: She has a normal mood and affect. Her behavior is normal. Judgment and thought content normal.     Assessment/Plan This is a 64 year old African American female admitted for hematemesis. 1. Hematemesis: Possibly Mallory-Weiss tears although there is no discernible etiology for  this at this time. The patient has not had any vomiting since my exam. Her blood pressure has improved to the low 335K systolic. I will admit the patient to the stepdown unit for monitoring. We will need to obtain a larger bore IV in case of hemorrhage. The patient has been typed and crossed for transfusion. She is currently mildly anemic but stable. Gastroenterology consult ordered. Continue Nexium drip.  2. Abdominal distention: No guarding or rebound. The patient does not have signs of peritonitis. She does not complain of postprandial pain I have ordered a CT angiogram of her abdomen to rule out mesenteric ischemia given her history of peripheral artery disease. 3. Peripheral artery disease: The patient reports that her feet sometimes turn blue and that she's been evaluated by vascular surgeon. She has been taking aspirin 81 mg. I have held her antiplatelet medication for now 4. Hypertension: Continue spironolactone once the patient is consistently normotensive. 5. Atrial fibrillation: Rate controlled. The patient had only been on aspirin. Risks and benefits of stopping aspirin discussed. Once bleeding controlled will resume antiplatelet therapy 6. Obesity: BMI is >30 (not yet recorded); encourage healthy diet and exercise 7. DVT prophylaxis: Subcutaneous heparin 8. GI prophylaxis: IV PPI The patient is a full code. Time spent on admission orders and critical patient care approximately 35 minutes  Harrie Foreman 09/10/2014, 1:35 AM

## 2014-09-10 NOTE — Progress Notes (Signed)
No page received for bed request. Informed by ed for bed request after 40+ minutes.

## 2014-09-10 NOTE — ED Notes (Signed)
Dr. Sheryle Hail aware that ED staff unable to obtain IV access for CT scan.

## 2014-09-10 NOTE — Consult Note (Signed)
  EGD showed old blood in stomach. Pt has esophageal varices, which likely bled last night. 6 esophageal bands placed. Start clear liquid diet. Ok for transfer to floor today if no further bleeding. Continue protonix IV. Started low dose nadolol. Question is why she has esophageal varices. No alcohol hx. LFT normal. However, INR elevated and PLTC low. Please order liver u/s tomorrow with doppler study. Will need repeat EGD in about 2 months to reassess esophageal varices. Will follow closely. Thanks.

## 2014-09-10 NOTE — Op Note (Signed)
[facility] Gastroenterology Patient Name: Robin Schneider Procedure Date: 09/10/2014 9:03 AM MRN: [id number] Account #: [id number] Date of Birth: 1950/04/25 Admit Type: Inpatient Age: 64 Room: [facility] ENDO ROOM 4 Gender: Female Note Status: Finalized Procedure:         Upper GI endoscopy Indications:       Hematemesis Providers:         Ezzard Standing. Bluford Kaufmann, MD Referring MD:      Leim Fabry, MD (Referring MD) Medicines:         Monitored Anesthesia Care Complications:     No immediate complications. Procedure:         Pre-Anesthesia Assessment:                    - Prior to the procedure, a History and Physical was                     performed, and patient medications, allergies and                     sensitivities were reviewed. The patient's tolerance of                     previous anesthesia was reviewed.                    - The risks and benefits of the procedure and the sedation                     options and risks were discussed with the patient. All                     questions were answered and informed consent was obtained.                    - After reviewing the risks and benefits, the patient was                     deemed in satisfactory condition to undergo the procedure.                    After obtaining informed consent, the endoscope was passed                     under direct vision. Throughout the procedure, the                     patient's blood pressure, pulse, and oxygen saturations                     were monitored continuously. The Olympus GIF-160 endoscope                     (S#. [id number]) was introduced through the mouth, and                     advanced to the second part of duodenum. The upper GI                     endoscopy was accomplished without difficulty. The patient                     tolerated the procedure well. Findings:      Grade II varices with no bleeding were found in the lower third of  the       esophagus. These  had stigmata of recent bleeding. They were medium in       largest diameter. Six bands were successfully placed with complete       eradication, resulting in deflation of varices. There was no bleeding at       the end of the procedure.      The exam was otherwise without abnormality.      Hematin (altered blood/coffee-ground-like material) was found in the       gastric fundus.      The exam was otherwise without abnormality.      The examined duodenum was normal. Impression:        - Recently bleeding grade II esophageal varices.                     Completely eradicated. Banded.                    - The examination was otherwise normal.                    - Hematin (altered blood/coffee-ground-like material) in                     the gastric fundus.                    - The examination was otherwise normal.                    - Normal examined duodenum.                    - No specimens collected. Recommendation:    - Observe patient's clinical course.                    - The findings and recommendations were discussed with the                     patient's family.                    - Continue protonix and octreotide. Evaluate liver. Procedure Code(s): --- Professional ---                    [phone], Esophagogastroduodenoscopy, flexible, transoral;                     with band ligation of esophageal/gastric varices Diagnosis Code(s): --- Professional ---                    I85.01, Esophageal varices with bleeding                    K92.2, Gastrointestinal hemorrhage, unspecified                    K92.0, Hematemesis CPT copyright 2014 American Medical Association. All rights reserved. The codes documented in this report are preliminary and upon coder review may  be revised to meet current compliance requirements. Wallace Cullens, MD 09/10/2014 9:44:05 AM This report has been signed electronically. Number of Addenda: 0 Note Initiated On: 09/10/2014 9:03 AM      [facility]

## 2014-09-11 ENCOUNTER — Inpatient Hospital Stay: Payer: Self-pay

## 2014-09-11 ENCOUNTER — Inpatient Hospital Stay: Payer: MEDICAID

## 2014-09-11 LAB — BASIC METABOLIC PANEL
Anion gap: 8 (ref 5–15)
BUN: 41 mg/dL — AB (ref 6–20)
CALCIUM: 9 mg/dL (ref 8.9–10.3)
CO2: 17 mmol/L — ABNORMAL LOW (ref 22–32)
CREATININE: 0.86 mg/dL (ref 0.44–1.00)
Chloride: 124 mmol/L — ABNORMAL HIGH (ref 101–111)
GFR calc Af Amer: >60 mL/min (ref >60)
GFR calc non Af Amer: >60 mL/min (ref >60)
GLUCOSE: 115 mg/dL — AB (ref 65–99)
POTASSIUM: 6.4 mmol/L — AB (ref 3.5–5.1)
Sodium: 149 mmol/L — ABNORMAL HIGH (ref 135–145)

## 2014-09-11 LAB — HEPATITIS A ANTIBODY, TOTAL: HEP A TOTAL AB: POSITIVE — AB

## 2014-09-11 LAB — URINALYSIS COMPLETE WITH MICROSCOPIC (ARMC ONLY)
BACTERIA UA: NONE SEEN
Bilirubin Urine: NEGATIVE
GLUCOSE, UA: NEGATIVE mg/dL
HGB URINE DIPSTICK: NEGATIVE
Ketones, ur: NEGATIVE mg/dL
Leukocytes, UA: NEGATIVE
Nitrite: NEGATIVE
Protein, ur: NEGATIVE mg/dL
SPECIFIC GRAVITY, URINE: 1.015 (ref 1.005–1.030)
pH: 5 (ref 5.0–8.0)

## 2014-09-11 LAB — CBC
HCT: 27.8 % — ABNORMAL LOW (ref 35.0–47.0)
Hemoglobin: 8.9 g/dL — ABNORMAL LOW (ref 12.0–16.0)
MCH: 24.4 pg — AB (ref 26.0–34.0)
MCHC: 31.9 g/dL — AB (ref 32.0–36.0)
MCV: 76.4 fL — AB (ref 80.0–100.0)
PLATELETS: 93 10*3/uL — AB (ref 150–440)
RBC: 3.64 MIL/uL — AB (ref 3.80–5.20)
RDW: 17.2 % — ABNORMAL HIGH (ref 11.5–14.5)
WBC: 11.5 10*3/uL — ABNORMAL HIGH (ref 3.6–11.0)

## 2014-09-11 LAB — HEPATITIS C ANTIBODY: HCV Ab: >11 s/co ratio — ABNORMAL HIGH (ref 0.0–0.9)

## 2014-09-11 LAB — AMMONIA: AMMONIA: 164 umol/L — AB (ref 9–35)

## 2014-09-11 LAB — HEPATITIS B SURFACE ANTIBODY, QUANTITATIVE: Hepatitis B-Post: 199 m[IU]/mL (ref >9.9)

## 2014-09-11 LAB — POTASSIUM: POTASSIUM: 4.9 mmol/L (ref 3.5–5.1)

## 2014-09-11 LAB — GLUCOSE, CAPILLARY: Glucose-Capillary: 95 mg/dL (ref 65–99)

## 2014-09-11 MED ORDER — FLEET ENEMA 7-19 GM/118ML RE ENEM
1.0000 | ENEMA | Freq: Once | RECTAL | Status: AC
  Administered 2014-09-11: 1 via RECTAL

## 2014-09-11 MED ORDER — SODIUM CHLORIDE 0.9 % IV SOLN
1.0000 g | Freq: Once | INTRAVENOUS | Status: AC
  Administered 2014-09-11: 1 g via INTRAVENOUS
  Filled 2014-09-11: qty 10

## 2014-09-11 MED ORDER — INSULIN REGULAR HUMAN 100 UNIT/ML IJ SOLN: 10.0000 [IU] | Freq: Once | INTRAMUSCULAR | Status: DC

## 2014-09-11 MED ORDER — INSULIN ASPART 100 UNIT/ML IV SOLN
10.0000 [IU] | Freq: Once | INTRAVENOUS | Status: AC
  Administered 2014-09-11: 10 [IU] via INTRAVENOUS
  Filled 2014-09-11: qty 0.1

## 2014-09-11 MED ORDER — SODIUM CHLORIDE 0.9 % IJ SOLN
10.0000 mL | Freq: Two times a day (BID) | INTRAMUSCULAR | Status: DC
  Administered 2014-09-11 – 2014-09-13 (×3): 10 mL via INTRAVENOUS

## 2014-09-11 MED ORDER — SODIUM POLYSTYRENE SULFONATE 15 GM/60ML PO SUSP
30.0000 g | Freq: Once | ORAL | Status: AC
  Administered 2014-09-11: 30 g via RECTAL
  Filled 2014-09-11: qty 120

## 2014-09-11 MED ORDER — DEXTROSE 50 % IV SOLN
25.0000 mL | Freq: Once | INTRAVENOUS | Status: AC
  Administered 2014-09-11: 25 mL via INTRAVENOUS
  Filled 2014-09-11: qty 50

## 2014-09-11 MED ORDER — SODIUM POLYSTYRENE SULFONATE 15 GM/60ML PO SUSP
30.0000 g | Freq: Four times a day (QID) | ORAL | Status: DC
  Administered 2014-09-11 – 2014-09-12 (×2): 30 g via RECTAL
  Filled 2014-09-11 (×2): qty 120

## 2014-09-11 MED ORDER — SODIUM POLYSTYRENE SULFONATE 15 GM/60ML PO SUSP
30.0000 g | ORAL | Status: DC
  Filled 2014-09-11: qty 120

## 2014-09-11 NOTE — Progress Notes (Signed)
Called Dr. Juliene Pina about pt being constipated and having difficulty giving the kayexelate because of the hard stool that was in the colon.  She gave me a one time order for fleet enema

## 2014-09-11 NOTE — Care Management Note (Addendum)
Case Management Note  Patient Details  Name: Robin Schneider MRN: [id number] Date of Birth: 08-12-1950  Subjective/Objective:  64yo Mrs Robin Schneider was admitted 09/09/14 per vomiting bright red bloody emesis. Received banding of esophageal varices. Takes daily ASA at home for A-Fib. K=6.4. Ammonia level is 164 elevated. Confused today and unable to provide information. PICC line ordered today. PT to start tomorrow per Dr Nemiah Commander. Case management will follow for discharge planning.                    Action/Plan:   Expected Discharge Date:                  Expected Discharge Plan:     In-House Referral:     Discharge planning Services     Post Acute Care Choice:    Choice offered to:     DME Arranged:    DME Agency:     HH Arranged:    HH Agency:     Status of Service:     Medicare Important Message Given:    Date Medicare IM Given:    Medicare IM give by:    Date Additional Medicare IM Given:    Additional Medicare Important Message give by:     If discussed at Long Length of Stay Meetings, dates discussed:    Additional Comments:  Robin Schneider A, RN 09/11/2014, 12:32 PM

## 2014-09-11 NOTE — Progress Notes (Signed)
PT Cancellation Note  Patient Details Name: Robin Schneider MRN: [id number] DOB: May 05, 1950   Cancelled Treatment:    Reason Eval/Treat Not Completed: Medical issues which prohibited therapy (Consult received and chart reviewed.  Patient noted with critical potassium level of 6.4; contraindicated for PT services at this time.  Will hold at this time and re-attempt as medically appropriate.)  Jennamarie Goings H. Manson Passey, PT, DPT, NCS 09/11/2014, 1:10 PM [phone]

## 2014-09-11 NOTE — Progress Notes (Signed)
[facility] - [place] [facility]   PATIENT NAME: Robin Schneider    MR#:  [id number]  DATE OF BIRTH:  January 15, 1951  SUBJECTIVE:  CHIEF COMPLAINT:   Chief Complaint  Patient presents with  . Hemoptysis  . Weakness   - Patient admitted for hematemesis, had EGD done yesterday and banding of esophageal diuresis. -No specific reason for her Barrett's is noted so far and hepatitis panel is pending. -Hemoglobin is stable now, did not receive any transfusion. But she is very confused this morning and sleepy too. -Testim is elevated, unable to draw labs so fingerstick was done to check potassium. PICC line pending.  REVIEW OF SYSTEMS:  Review of Systems  Unable to perform ROS: mental status change    DRUG ALLERGIES:   Allergies  Allergen Reactions  . Morphine And Related Rash  . Percocet [Oxycodone-Acetaminophen] Rash    VITALS:  Blood pressure 159/72, pulse 75, temperature 98 F (36.7 C), temperature source Oral, resp. rate 18, height  (1.702 m), weight 84.414 kg (186 lb 1.6 oz), SpO2 100 %.  PHYSICAL EXAMINATION:  Physical Exam  GENERAL:  64 y.o.-year-old patient lying in the bed with no acute distress.  EYES: Pupils equal, round, reactive to light and accommodation. No scleral icterus. Extraocular muscles intact.  HEENT: Head atraumatic, normocephalic. Oropharynx and nasopharynx clear.  NECK:  Supple, no jugular venous distention. No thyroid enlargement, no tenderness.  LUNGS: Normal breath sounds bilaterally, no wheezing, rales,rhonchi or crepitation. No use of accessory muscles of respiration.  CARDIOVASCULAR: S1, S2 normal. No murmurs, rubs, or gallops.  ABDOMEN: Soft, nontender, nondistended. Bowel sounds present. No organomegaly or mass.  EXTREMITIES: No pedal edema, cyanosis, or clubbing.  NEUROLOGIC: Cranial nerves II through XII are intact. Patient is very sleepy arousable and follows some simple commands. Is very confused. Husband at  bedside. PSYCHIATRIC: The patient is alert and not oriented.  SKIN: No obvious rash, lesion, or ulcer.    LABORATORY PANEL:   CBC  Recent Labs Lab 09/11/14 0608  WBC 11.5*  HGB 8.9*  HCT 27.8*  PLT 93*   ------------------------------------------------------------------------------------------------------------------  Chemistries   Recent Labs Lab 09/09/14 2210 09/11/14 0608  NA  --  149*  K  --  6.4*  CL  --  124*  CO2  --  17*  GLUCOSE  --  115*  BUN  --  41*  CREATININE  --  0.86  CALCIUM  --  9.0  AST 40  --   ALT 16  --   ALKPHOS 105  --   BILITOT 1.1  --    ------------------------------------------------------------------------------------------------------------------  Cardiac Enzymes No results for input(s): TROPONINI in the last 168 hours. ------------------------------------------------------------------------------------------------------------------  RADIOLOGY:  Dg Chest Port 1 View  09/09/2014   CLINICAL DATA:  64 year old female coughing up blood this evening. Weakness. Initial encounter.  EXAM: PORTABLE CHEST - 1 VIEW  COMPARISON:  03/05/2014.  FINDINGS: Portable AP upright view at 2210 hrs. Stable cardiomegaly and mediastinal contours. Stable lung volumes. Chronic scarring in the lingula and along the minor fissure. No pneumothorax, pulmonary edema, pleural effusion or acute pulmonary opacity.  IMPRESSION: Stable cardiomegaly and pulmonary scarring. No acute cardiopulmonary abnormality.   Electronically Signed   By: Odessa Fleming M.D.   On: 09/09/2014 22:31    EKG:   Orders placed or performed during the hospital encounter of 09/09/14  . ED EKG  . ED EKG  . EKG 12-Lead  . EKG 12-Lead    ASSESSMENT  AND PLAN:   64y/o AAF with PMH of Hep C, Afib on asa, HTN, PAD admitted for Hematemesis.  * GI bleed- variceal upper GI bleed.  - Hemoglobin drop noted however likely dilutional. No indication for any transfusion at this time. - GI consulted - EGD  with esophageal varices and bands have been placed. Started on octreotide drip yesterday-change to half the rate today and discontinue tomorrow. -Continue IV Protonix twice a day at this time. - BP is stable and no active bleeding  *. Altered mental status-likely metabolic encephalopathy. Check ammonia - worse since her procedure yesterday - CT head will be ordered after labs are done if everything else is ruled out. - monitor closely.  * Varices-? No underlying liver disease - was told that she has Hep C in [place] several years ago, last year at [facility] was told that she doesn't have Hep C - hepatitis panel sent - likely has underlying liver disease  * HTN- BP stable, hold aldactone  * Hyperkalemia- likely from aldactone, however Aldactone has been discontinued yesterday. Not sure if the potassium is 2 as blood could have been hemolyzed as they were able to only get fingerstick blood only-she is a hard stick. -Insulin dextrose, Kayexalate have been ordered and repeat potassium check pending at this time.  * Afib and junctional rhythm- known chronic history, hold asa with GI bleed - Rate is controlled, not on any meds  * DVT prophylaxis- cont SQ heparin for now   All the records are reviewed and case discussed with Care Management/Social Workerr. Management plans discussed with the patient, family and they are in agreement.  CODE STATUS: FULL CODE  TOTAL CRITICAL CARE TIME SPENT IN TAKING CARE OF THIS PATIENT: 38 minutes.   POSSIBLE D/C IN 1-2 DAYS, DEPENDING ON CLINICAL CONDITION.   Yohanna Tow M.D on 09/11/2014 at 12:47 PM  Between 7am to 6pm - Pager - [phone]  After 6pm go to [email or web address] - password [email or web address] [facility]  [facility]  Office  [phone]  CC: Primary care physician; Leim Fabry, MD

## 2014-09-11 NOTE — Progress Notes (Signed)
Patient has been very lethargic all day.  This morning her potassium was 6.4.  MD gave some orders for IV dextrose and insulin as well as kayexelate.  Patient was unable to orally take the kayexelate so it was given rectally.  Lab was unable to draw blood and a PICC line was placed.  Labs were drawn this afternoon and potassium had improved but her ammonia level was elevated.  This afternoon patient got very congested and had a lot of phlegm in her throat that was blood tinged.  MDs were notified of blood and labs.  When kayexelate was given this afternoon, there was a lot of hard stool in the colon and it was difficult giving the medication.  MD then gave an order for a one time enema to loosen it up.

## 2014-09-11 NOTE — Progress Notes (Addendum)
Dr. Nemiah Commander notified of ammonia level and patient coughing up some blood.  She gave me an order for the [facility] and to notify Dr. Bluford Kaufmann.  I then notified Dr. Bluford Kaufmann of the labs and the coughing up blood and he just acknowledged it and also recommended the kayexelate

## 2014-09-11 NOTE — Consult Note (Signed)
  GI Inpatient Follow-up Note  Patient Identification: Robin Schneider is a 64 y.o. female with UGI bleed.  Subjective: Hard to arouse today. IV access problem. So, PICC placed during lunchtime. K high, which could be spurious. Repeat K pending. Question of HCV in the past. Liver U/S pending. No further UGI bleeding. S/P esophageal banding. Scheduled Inpatient Medications:  . olopatadine  1 drop Both Eyes BID  . pantoprazole (PROTONIX) IV  40 mg Intravenous Q12H  . sodium chloride  3 mL Intravenous Q12H    Continuous Inpatient Infusions:   . octreotide  (SANDOSTATIN)    IV infusion 25 mcg/hr (09/11/14 1332)    PRN Inpatient Medications:  albuterol, diazepam, ondansetron **OR** ondansetron (ZOFRAN) IV  Review of Systems: Constitutional: Weight is stable.  Eyes: No changes in vision. ENT: No oral lesions, sore throat.  GI: see HPI.  Heme/Lymph: No easy bruising.  CV: No chest pain.  GU: No hematuria.  Integumentary: No rashes.  Neuro: No headaches.  Psych: No depression/anxiety.  Endocrine: No heat/cold intolerance.  Allergic/Immunologic: No urticaria.  Resp: No cough, SOB.  Musculoskeletal: No joint swelling.    Physical Examination: BP 159/72 mmHg  Pulse 75  Temp(Src) 98 F (36.7 C) (Oral)  Resp 18  Ht 5\' 7"  (1.702 m)  Wt 84.414 kg (186 lb 1.6 oz)  BMI 29.14 kg/m2  SpO2 100%  LMP  (LMP Unknown) Gen: NAD, encephalopathic. HEENT: PEERLA, EOMI, Neck: supple, no JVD or thyromegaly Chest: CTA bilaterally, no wheezes, crackles, or other adventitious sounds CV: RRR, no m/g/c/r Abd: soft, NT, Distended, +BS in all four quadrants; no HSM, guarding, ridigity, or rebound tenderness.  Ext: no edema, well perfused with 2+ pulses, Skin: no rash or lesions noted Lymph: no LAD  Data: Lab Results  Component Value Date   WBC 11.5* 09/11/2014   HGB 8.9* 09/11/2014   HCT 27.8* 09/11/2014   MCV 76.4* 09/11/2014   PLT 93* 09/11/2014    Recent Labs Lab  09/09/14 2201 09/10/14 0411 09/11/14 0608  HGB 10.1* 9.6* 8.9*   Lab Results  Component Value Date   NA 149* 09/11/2014   K 6.4* 09/11/2014   CL 124* 09/11/2014   CO2 17* 09/11/2014   BUN 41* 09/11/2014   CREATININE 0.86 09/11/2014   Lab Results  Component Value Date   ALT 16 09/09/2014   AST 40 09/09/2014   ALKPHOS 105 09/09/2014   BILITOT 1.1 09/09/2014    Recent Labs Lab 09/09/14 2210  INR 1.64   Assessment/Plan: Robin Schneider is a 64 y.o. female  With UGI bleeding from esophageal varices. S/P esophageal banding. Appears to be encephalopathic today.  Recommendations: Await U/S liver results. Ordered Hep C RNA level and Hep C genotype. Await ammonia level. If elevated, start lactulose. If no further bleeding, taper off octreotide and restart nadolol daily to reduce portal pressures, if BP stable. Once pt more awake, can advance to mechanical soft diet for few days so that esophageal bands do not fall off prematurely. thanks Please call with questions or concerns.  Robin Schneider, Robin Standing, MD

## 2014-09-12 ENCOUNTER — Inpatient Hospital Stay: Payer: Self-pay

## 2014-09-12 ENCOUNTER — Inpatient Hospital Stay: Payer: MEDICAID

## 2014-09-12 DIAGNOSIS — K7290 Hepatic failure, unspecified without coma: Secondary | ICD-10-CM

## 2014-09-12 DIAGNOSIS — J9600 Acute respiratory failure, unspecified whether with hypoxia or hypercapnia: Secondary | ICD-10-CM

## 2014-09-12 DIAGNOSIS — K7682 Hepatic encephalopathy: Secondary | ICD-10-CM

## 2014-09-12 DIAGNOSIS — K920 Hematemesis: Secondary | ICD-10-CM

## 2014-09-12 LAB — COMPREHENSIVE METABOLIC PANEL
ALT: 53 U/L (ref 14–54)
ANION GAP: 5 (ref 5–15)
AST: 78 U/L — ABNORMAL HIGH (ref 15–41)
Albumin: 3.3 g/dL — ABNORMAL LOW (ref 3.5–5.0)
Alkaline Phosphatase: 98 U/L (ref 38–126)
BUN: 58 mg/dL — ABNORMAL HIGH (ref 6–20)
CO2: 20 mmol/L — ABNORMAL LOW (ref 22–32)
CREATININE: 1.1 mg/dL — AB (ref 0.44–1.00)
Calcium: 8.4 mg/dL — ABNORMAL LOW (ref 8.9–10.3)
Chloride: 120 mmol/L — ABNORMAL HIGH (ref 101–111)
GFR, EST NON AFRICAN AMERICAN: 52 mL/min — AB (ref >60)
GLUCOSE: 183 mg/dL — AB (ref 65–99)
Potassium: 4.1 mmol/L (ref 3.5–5.1)
Sodium: 145 mmol/L (ref 135–145)
TOTAL PROTEIN: 6.2 g/dL — AB (ref 6.5–8.1)
Total Bilirubin: 1.5 mg/dL — ABNORMAL HIGH (ref 0.3–1.2)

## 2014-09-12 LAB — CBC
HCT: 27.9 % — ABNORMAL LOW (ref 35.0–47.0)
HEMATOCRIT: 26.1 % — AB (ref 35.0–47.0)
HEMOGLOBIN: 8.1 g/dL — AB (ref 12.0–16.0)
Hemoglobin: 8.7 g/dL — ABNORMAL LOW (ref 12.0–16.0)
MCH: 23.9 pg — ABNORMAL LOW (ref 26.0–34.0)
MCH: 23.9 pg — AB (ref 26.0–34.0)
MCHC: 31 g/dL — ABNORMAL LOW (ref 32.0–36.0)
MCHC: 31.2 g/dL — AB (ref 32.0–36.0)
MCV: 77.2 fL — ABNORMAL LOW (ref 80.0–100.0)
MCV: 76.7 fL — AB (ref 80.0–100.0)
PLATELETS: 116 10*3/uL — AB (ref 150–440)
Platelets: 103 10*3/uL — ABNORMAL LOW (ref 150–440)
RBC: 3.38 MIL/uL — ABNORMAL LOW (ref 3.80–5.20)
RBC: 3.63 MIL/uL — ABNORMAL LOW (ref 3.80–5.20)
RDW: 17.8 % — ABNORMAL HIGH (ref 11.5–14.5)
RDW: 17.7 % — AB (ref 11.5–14.5)
WBC: 12.1 10*3/uL — AB (ref 3.6–11.0)
WBC: 14.8 10*3/uL — ABNORMAL HIGH (ref 3.6–11.0)

## 2014-09-12 LAB — BASIC METABOLIC PANEL
Anion gap: 9 (ref 5–15)
BUN: 50 mg/dL — AB (ref 6–20)
CHLORIDE: 117 mmol/L — AB (ref 101–111)
CO2: 21 mmol/L — ABNORMAL LOW (ref 22–32)
Calcium: 8.8 mg/dL — ABNORMAL LOW (ref 8.9–10.3)
Creatinine, Ser: 1.07 mg/dL — ABNORMAL HIGH (ref 0.44–1.00)
GFR calc Af Amer: >60 mL/min (ref >60)
GFR calc non Af Amer: 54 mL/min — ABNORMAL LOW (ref >60)
GLUCOSE: 155 mg/dL — AB (ref 65–99)
Potassium: 4.6 mmol/L (ref 3.5–5.1)
Sodium: 147 mmol/L — ABNORMAL HIGH (ref 135–145)

## 2014-09-12 LAB — GLUCOSE, CAPILLARY
Glucose-Capillary: 144 mg/dL — ABNORMAL HIGH (ref 65–99)
Glucose-Capillary: 175 mg/dL — ABNORMAL HIGH (ref 65–99)
Glucose-Capillary: 205 mg/dL — ABNORMAL HIGH (ref 65–99)

## 2014-09-12 LAB — TYPE AND SCREEN
ABO/RH(D): A POS
ANTIBODY SCREEN: POSITIVE

## 2014-09-12 LAB — PROTIME-INR
INR: 1.67
PROTHROMBIN TIME: 19.9 s — AB (ref 11.4–15.0)

## 2014-09-12 LAB — APTT: APTT: 27 s (ref 24–36)

## 2014-09-12 LAB — AMMONIA
AMMONIA: 364 umol/L — AB (ref 9–35)
AMMONIA: 491 umol/L — AB (ref 9–35)

## 2014-09-12 MED ORDER — FENTANYL CITRATE (PF) 100 MCG/2ML IJ SOLN
100.0000 ug | INTRAMUSCULAR | Status: DC | PRN
  Administered 2014-09-13: 100 ug via INTRAVENOUS
  Filled 2014-09-12: qty 2

## 2014-09-12 MED ORDER — LORAZEPAM 2 MG/ML IJ SOLN
4.0000 mg | Freq: Once | INTRAMUSCULAR | Status: AC
  Administered 2014-09-12: 4 mg via INTRAVENOUS

## 2014-09-12 MED ORDER — VECURONIUM BROMIDE 10 MG IV SOLR
10.0000 mg | Freq: Once | INTRAVENOUS | Status: AC
  Administered 2014-09-12: 10 mg via INTRAVENOUS

## 2014-09-12 MED ORDER — FENTANYL 2500MCG IN NS 250ML (10MCG/ML) PREMIX INFUSION
10.0000 ug/h | INTRAVENOUS | Status: DC
  Administered 2014-09-12: 50 ug/h via INTRAVENOUS
  Filled 2014-09-12: qty 250

## 2014-09-12 MED ORDER — LORAZEPAM 2 MG/ML IJ SOLN
INTRAMUSCULAR | Status: AC
  Administered 2014-09-13: 2 mg via INTRAVENOUS
  Filled 2014-09-12: qty 2

## 2014-09-12 MED ORDER — LORAZEPAM 2 MG/ML IJ SOLN
INTRAMUSCULAR | Status: AC
  Filled 2014-09-12: qty 1

## 2014-09-12 MED ORDER — PROPOFOL 1000 MG/100ML IV EMUL
5.0000 ug/kg/min | INTRAVENOUS | Status: DC
  Administered 2014-09-12: 10 ug/kg/min via INTRAVENOUS

## 2014-09-12 MED ORDER — LACTULOSE 10 GM/15ML PO SOLN: 30.0000 g | ORAL | Status: DC

## 2014-09-12 MED ORDER — INSULIN ASPART 100 UNIT/ML ~~LOC~~ SOLN
0.0000 [IU] | SUBCUTANEOUS | Status: DC
  Administered 2014-09-12: 5 [IU] via SUBCUTANEOUS
  Administered 2014-09-12: 3 [IU] via SUBCUTANEOUS
  Administered 2014-09-12: 2 [IU] via SUBCUTANEOUS
  Administered 2014-09-13 (×2): 3 [IU] via SUBCUTANEOUS
  Administered 2014-09-13: 2 [IU] via SUBCUTANEOUS
  Filled 2014-09-12 (×2): qty 3
  Filled 2014-09-12: qty 2
  Filled 2014-09-12: qty 5
  Filled 2014-09-12: qty 3
  Filled 2014-09-12: qty 2

## 2014-09-12 MED ORDER — SODIUM CHLORIDE 0.9 % IV SOLN
1000.0000 mg | Freq: Once | INTRAVENOUS | Status: AC
  Administered 2014-09-12: 1000 mg via INTRAVENOUS
  Filled 2014-09-12: qty 10

## 2014-09-12 MED ORDER — LORAZEPAM 2 MG/ML IJ SOLN
INTRAMUSCULAR | Status: AC
  Administered 2014-09-12: 4 mg via INTRAVENOUS
  Filled 2014-09-12: qty 2

## 2014-09-12 MED ORDER — M.V.I. ADULT IV INJ
INTRAVENOUS | Status: DC
  Administered 2014-09-12 – 2014-09-13 (×2): via INTRAVENOUS
  Filled 2014-09-12 (×2): qty 10

## 2014-09-12 MED ORDER — LACTULOSE ENEMA
300.0000 mL | Freq: Once | ORAL | Status: AC
  Administered 2014-09-12: 300 mL via RECTAL
  Filled 2014-09-12: qty 300

## 2014-09-12 MED ORDER — PROPOFOL 1000 MG/100ML IV EMUL
INTRAVENOUS | Status: AC
  Administered 2014-09-12: 10 ug/kg/min via INTRAVENOUS
  Filled 2014-09-12: qty 100

## 2014-09-12 MED ORDER — LACTULOSE ENEMA
300.0000 mL | Freq: Three times a day (TID) | ORAL | Status: DC
  Filled 2014-09-12 (×3): qty 300

## 2014-09-12 MED ORDER — FENTANYL CITRATE (PF) 100 MCG/2ML IJ SOLN
200.0000 ug | Freq: Once | INTRAMUSCULAR | Status: AC
  Administered 2014-09-12: 200 ug via INTRAVENOUS
  Filled 2014-09-12: qty 4

## 2014-09-12 MED ORDER — LORAZEPAM 2 MG/ML IJ SOLN
4.0000 mg | Freq: Once | INTRAMUSCULAR | Status: AC
  Administered 2014-09-12: 4 mg via INTRAVENOUS
  Filled 2014-09-12: qty 2

## 2014-09-12 MED ORDER — LORAZEPAM 2 MG/ML IJ SOLN
1.0000 mg/h | INTRAVENOUS | Status: DC
  Administered 2014-09-13: 8 mg/h via INTRAVENOUS
  Administered 2014-09-13: 5 mg/h via INTRAVENOUS
  Filled 2014-09-12 (×3): qty 25

## 2014-09-12 MED ORDER — MIDAZOLAM HCL 2 MG/2ML IJ SOLN
2.0000 mg | INTRAMUSCULAR | Status: DC | PRN
  Administered 2014-09-12 – 2014-09-13 (×2): 4 mg via INTRAVENOUS
  Filled 2014-09-12 (×2): qty 4

## 2014-09-12 MED ORDER — LORAZEPAM 2 MG/ML IJ SOLN
INTRAMUSCULAR | Status: AC
Start: 2014-09-12 — End: 2014-09-12
  Administered 2014-09-12: 2 mg via INTRAVENOUS
  Filled 2014-09-12: qty 1

## 2014-09-12 MED ORDER — PHENOBARBITAL SODIUM 130 MG/ML IJ SOLN
5.0000 mg/kg | Freq: Once | INTRAMUSCULAR | Status: AC
  Administered 2014-09-12: 413.4 mg via INTRAVENOUS
  Filled 2014-09-12: qty 4

## 2014-09-12 MED ORDER — MIDAZOLAM HCL 2 MG/2ML IJ SOLN
2.0000 mg | Freq: Once | INTRAMUSCULAR | Status: AC
Start: 2014-09-12 — End: 2014-09-12
  Administered 2014-09-12: 2 mg via INTRAVENOUS
  Filled 2014-09-12: qty 2

## 2014-09-12 MED ORDER — LACTULOSE 10 GM/15ML PO SOLN
30.0000 g | ORAL | Status: DC
  Administered 2014-09-12 – 2014-09-13 (×5): 30 g via ORAL
  Filled 2014-09-12 (×4): qty 60
  Filled 2014-09-12: qty 30
  Filled 2014-09-12: qty 60
  Filled 2014-09-12: qty 30

## 2014-09-12 MED ORDER — LORAZEPAM 2 MG/ML IJ SOLN
2.0000 mg | Freq: Once | INTRAMUSCULAR | Status: AC
  Administered 2014-09-12 – 2014-09-13 (×2): 2 mg via INTRAVENOUS

## 2014-09-12 MED ORDER — CEFTRIAXONE SODIUM IN DEXTROSE 20 MG/ML IV SOLN
1.0000 g | INTRAVENOUS | Status: DC
  Administered 2014-09-12: 1 g via INTRAVENOUS
  Filled 2014-09-12 (×3): qty 50

## 2014-09-12 MED ORDER — SODIUM CHLORIDE 0.9 % IV SOLN: INTRAVENOUS | Status: DC

## 2014-09-12 MED ORDER — LACTULOSE ENEMA
300.0000 mL | ORAL | Status: DC
  Administered 2014-09-12 – 2014-09-13 (×2): 300 mL via RECTAL
  Filled 2014-09-12 (×14): qty 300

## 2014-09-12 MED ORDER — DEXTROSE-NACL 5-0.2 % IV SOLN
INTRAVENOUS | Status: DC
  Administered 2014-09-12: 15:00:00 via INTRAVENOUS

## 2014-09-12 NOTE — Progress Notes (Signed)
Called by nursing staff for seizure Evaluated at bedside Tonic clonic seizure lasting 7-65mins, received ativan 2mg , ativan 4mg  -- no further seizure activity  Prior to episode - patient on vent, sedated with fentanyl, essentially unresponsive She was transferred to ICU earlier today for mental status - unresponsive, then intubated  Currently patient without gag reflex, pupils 4-21mm minimal pupillary response However unable to get proper neuro exam given previous neuro status, and sedation  Will keppra load, CT head without contrast STAT Will follow

## 2014-09-12 NOTE — Progress Notes (Signed)
Dr. Belia Heman at bedside evaluating moderate amount of bright red blood from ETT.  Orders received.

## 2014-09-12 NOTE — Progress Notes (Signed)
Pt intubated by Peyton Najjar, CP with 7.5 ETT, 22cm at lip.  Positive c02 color change, condensation noted in tube, and equal bilateral breath sounds.

## 2014-09-12 NOTE — Progress Notes (Signed)
Patient transported for dobb hoff placement.

## 2014-09-12 NOTE — Progress Notes (Signed)
PT Cancellation Note  Patient Details Name: Robin Schneider MRN: [id number] DOB: [birth year]   Cancelled Treatment:    Reason Eval/Treat Not Completed: Medical issues which prohibited therapy (Consult received and chart reviewed.  Per primary RN, patient with very high ammonia levels, minimally arousable.  Unable to participate with therapy at this time.  Will hold and re-attempt next date as medically appropriate.)   Ameshia Pewitt H. Manson Passey, PT, DPT, NCS 09/12/2014, 8:30 AM [phone]

## 2014-09-12 NOTE — Progress Notes (Signed)
Pt noted to be increasingly more lethargic through out shift. No urinary output and small BM. Notified nurse supervisor of pt condition, MD notified as well. Received orders for foley catheter, Lactulose enema, ABGs, and stat ammonia level. MD is also okay with holding kayaxelate r/t pt improving potassium levels. Pt is barely responsive. Will continue to monitor.

## 2014-09-12 NOTE — Progress Notes (Signed)
GI Inpatient Follow-up Note  Patient Identification: Robin Schneider is a 64 y.o. female with UGi bleed  Subjective: Robin Schneider has been transferred to ICU and was intubated.  She arrived in the ICU unresponsive.  Her ammonia level went from a 164 yesterday to a 364. The nurse reported that they were prepping her to give the lactulose suppositories.   She is S/P esophageal banding, no further UGI bleeding.  Please see below for US abdomen results.  Scheduled Inpatient Medications:  . cefTRIAXone (ROCEPHIN)  IV  1 g Intravenous Q24H  . lactulose  300 mL Rectal Q4H  . olopatadine  1 drop Both Eyes BID  . pantoprazole (PROTONIX) IV  40 mg Intravenous Q12H  . propofol      . sodium chloride  10 mL Intravenous Q12H  . sodium chloride  3 mL Intravenous Q12H    Continuous Inpatient Infusions:   . fentaNYL infusion INTRAVENOUS    . octreotide  (SANDOSTATIN)    IV infusion 25 mcg/hr (09/11/14 1332)    PRN Inpatient Medications:  albuterol, midazolam, [DISCONTINUED] ondansetron **OR** ondansetron (ZOFRAN) IV  Review of Systems: Unable to perform, patient unresponsive and intubated  Physical Examination: BP 118/55 mmHg  Pulse 70  Temp(Src) 98.3 F (36.8 C) (Oral)  Resp 15  Ht 5\' 7"  (1.702 m)  Wt 82.691 kg (182 lb 4.8 oz)  BMI 28.55 kg/m2  SpO2 100%  LMP  (LMP Unknown) Gen: patient is unresponsive and intubated HEENT: unable to access Neck: supple, no JVD or thyromegaly Chest: CTA bilaterally, no wheezes, crackles, or other adventitious sounds CV: RRR, no m/g/c/r Abd: distended Ext: no edema, well perfused with 2+ pulses, Skin: no rash or lesions noted Lymph: no LAD  Data: Lab Results  Component Value Date   WBC 12.1* 09/12/2014   HGB 8.1* 09/12/2014   HCT 26.1* 09/12/2014   MCV 77.2* 09/12/2014   PLT 103* 09/12/2014    Recent Labs Lab 09/10/14 0411 09/11/14 0608 09/12/14 0523  HGB 9.6* 8.9* 8.1*   Lab Results  Component Value Date   NA 147*  09/12/2014   K 4.6 09/12/2014   CL 117* 09/12/2014   CO2 21* 09/12/2014   BUN 50* 09/12/2014   CREATININE 1.07* 09/12/2014   Lab Results  Component Value Date   ALT 16 09/09/2014   AST 40 09/09/2014   ALKPHOS 105 09/09/2014   BILITOT 1.1 09/09/2014    Recent Labs Lab 09/09/14 2210  INR 1.64   Imaging:  CLINICAL DATA: Cirrhosis  EXAM: ULTRASOUND ABDOMEN COMPLETE  COMPARISON: None.  FINDINGS: Gallbladder: No gallstones or pericholecystic fluid is noted. Mild wall thickening is noted in parts of the gallbladder are related to the underlying ascites.  Common bile duct: Diameter: Varies in size from 6-4 mm.  Liver: Diffuse increased echogenicity is noted with some nodularity consistent with the given clinical history of cirrhosis.  IVC: No abnormality visualized.  Pancreas: Visualized portion unremarkable.  Spleen: Size and appearance within normal limits.  Right Kidney: Length: 11.3 cm. Echogenicity within normal limits. No mass or hydronephrosis visualized.  Left Kidney: Length: 9.3 cm. Echogenicity within normal limits. No mass or hydronephrosis visualized.  Abdominal aorta: No aneurysm visualized.  Other findings: Mild ascites is noted.  IMPRESSION: Changes consistent with cirrhosis of the liver with mild ascites.  No other focal abnormality is noted.   Electronically Signed  By: Alcide Clever M.D.  On: 09/12/2014 09:50 Assessment/Plan: Robin Schneider is a 64 y.o. female S/P esophageal banding.  She  was transferred to ICU and was intubated.  She was unresponsive when she arrived at ICU.    Recommendations: Agree with lactulose. Continue with octreotide until it can be confirmed that the UGI bleeding has stopped. It is okay with [place] if she was to transfer to another facility.  We will continue to follow her. Please call with questions or concerns.  Carney Harder, PA-C  I personally performed these services.

## 2014-09-12 NOTE — Consult Note (Signed)
PULMONARY / CRITICAL CARE MEDICINE   Name: Robin Schneider MRN: [id number] DOB: June 23, 1950    ADMISSION DATE:  09/09/2014    CHIEF COMPLAINT:   Acute resp failure, mental status changes   HISTORY OF PRESENT ILLNESS   64 yo AAF admitted to ICU for acute mental status and intubated for acute resp failure Patient s/p EGD for banding varices  Patient with hemetemisis, patient with h/o Hep c and ETOh abuse multiple medical issues    SIGNIFICANT EVENTS  Intubated 6/21    PAST MEDICAL HISTORY    :  Past Medical History  Diagnosis Date  . Hypertension   . A-fib   . PAD (peripheral artery disease)    Past Surgical History  Procedure Laterality Date  . Replacement total knee bilateral    . Cardiac electrophysiology study and ablation      Alblation  . Abdominal hysterectomy     Prior to Admission medications   Medication Sig Start Date End Date Taking? Authorizing Provider  albuterol (ACCUNEB) 1.25 MG/3ML nebulizer solution Inhale 3 mLs into the lungs every 6 (six) hours as needed for wheezing or shortness of breath.    Yes Historical Provider, MD  albuterol (VENTOLIN HFA) 108 (90 BASE) MCG/ACT inhaler Inhale 2 puffs into the lungs every 4 (four) hours as needed for wheezing or shortness of breath.  02/04/10  Yes Historical Provider, MD  aspirin EC 81 MG tablet Take 1 tablet by mouth daily.    Yes Historical Provider, MD  diazepam (VALIUM) 5 MG tablet Take 10 mg by mouth every 12 (twelve) hours as needed for anxiety.  10/12/12  Yes Historical Provider, MD  diclofenac sodium (VOLTAREN) 1 % GEL Apply 2 g topically 2 (two) times daily. 07/25/14 07/25/15 Yes Historical Provider, MD  nortriptyline (PAMELOR) 25 MG capsule Take 25 mg by mouth at bedtime.   Yes Historical Provider, MD  olopatadine (PATANOL) 0.1 % ophthalmic solution Apply 1 drop to eye 2 (two) times daily. 06/19/14  Yes Historical Provider, MD  pregabalin (LYRICA) 75 MG capsule Take 1 capsule by mouth 2 (two)  times daily. 07/12/14 07/12/15 Yes Historical Provider, MD  spironolactone (ALDACTONE) 50 MG tablet Take 1 tablet by mouth daily at 12 noon. 08/19/13  Yes Historical Provider, MD   Allergies  Allergen Reactions  . Morphine And Related Rash  . Percocet [Oxycodone-Acetaminophen] Rash     FAMILY HISTORY   Family History  Problem Relation Age of Onset  . Cancer      various types; throughout the family  . Hypertension      multiple family members      SOCIAL HISTORY    reports that she has been smoking Cigarettes.  She has a 24 pack-year smoking history. She does not have any smokeless tobacco history on file. She reports that she does not drink alcohol or use illicit drugs.  Review of Systems  Unable to perform ROS: critical illness      VITAL SIGNS    Temp:  [98.3 F (36.8 C)-98.6 F (37 C)] 98.3 F (36.8 C) (06/21 0417) Pulse Rate:  [68-99] 70 (06/21 1100) Resp:  [14-42] 15 (06/21 1100) BP: (100-177)/(53-88) 118/55 mmHg (06/21 1100) SpO2:  [85 %-100 %] 100 % (06/21 1100) Weight:  [182 lb 4.8 oz (82.691 kg)] 182 lb 4.8 oz (82.691 kg) (06/21 0500) HEMODYNAMICS:   VENTILATOR SETTINGS:   INTAKE / OUTPUT:  Intake/Output Summary (Last 24 hours) at 09/12/14 1124 Last data filed at 09/12/14 0700  Gross  per 24 hour  Intake    346 ml  Output   1550 ml  Net  -1204 ml       PHYSICAL EXAM   Physical Exam  Constitutional: She appears distressed.  HENT:  Head: Normocephalic and atraumatic.  Eyes: Pupils are equal, round, and reactive to light. No scleral icterus.  Neck: Normal range of motion. Neck supple.  Cardiovascular: Normal rate and regular rhythm.   No murmur heard. Pulmonary/Chest: She is in respiratory distress. She has no wheezes. She has rales.  resp distress  Abdominal: Soft. She exhibits no distension. There is no tenderness.  Musculoskeletal: She exhibits no edema.  Neurological: She displays normal reflexes. Coordination normal.  gcs<8T  Skin:  Skin is warm. No rash noted. She is diaphoretic.       LABS   LABS:  CBC  Recent Labs Lab 09/09/14 2201 09/10/14 0411 09/11/14 0608 09/12/14 0523  WBC 7.8  --  11.5* 12.1*  HGB 10.1* 9.6* 8.9* 8.1*  HCT 32.4*  --  27.8* 26.1*  PLT 120*  --  93* 103*   Coag's  Recent Labs Lab 09/09/14 2210  INR 1.64   BMET  Recent Labs Lab 09/09/14 2201 09/11/14 0608 09/11/14 1446 09/12/14 0523  NA 135 149*  --  147*  K 5.5* 6.4* 4.9 4.6  CL 110 124*  --  117*  CO2 21* 17*  --  21*  BUN 26* 41*  --  50*  CREATININE 0.98 0.86  --  1.07*  GLUCOSE 203* 115*  --  155*   Electrolytes  Recent Labs Lab 09/09/14 2201 09/11/14 0608 09/12/14 0523  CALCIUM 7.9* 9.0 8.8*   Sepsis Markers  Recent Labs Lab 09/10/14 0052 09/10/14 0411  LATICACIDVEN 1.6 1.9   ABG  Recent Labs Lab 09/12/14 0459  PHART 7.45  PCO2ART 29*  PO2ART 147*   Liver Enzymes  Recent Labs Lab 09/09/14 2210  AST 40  ALT 16  ALKPHOS 105  BILITOT 1.1  ALBUMIN 3.0*   Cardiac Enzymes No results for input(s): TROPONINI, PROBNP in the last 168 hours. Glucose  Recent Labs Lab 09/09/14 2121 09/11/14 0855  GLUCAP 221* 95     Recent Results (from the past 240 hour(s))  MRSA PCR Screening     Status: None   Collection Time: 09/10/14  8:19 AM  Result Value Ref Range Status   MRSA by PCR NEGATIVE NEGATIVE Final    Comment:        The GeneXpert MRSA Assay (FDA approved for NASAL specimens only), is one component of a comprehensive MRSA colonization surveillance program. It is not intended to diagnose MRSA infection nor to guide or monitor treatment for MRSA infections.      Current facility-administered medications:  .  albuterol (PROVENTIL) (2.5 MG/3ML) 0.083% nebulizer solution 3 mL, 3 mL, Inhalation, Q4H PRN, Harrie Foreman, MD .  cefTRIAXone (ROCEPHIN) 1 g in dextrose 5 % 50 mL IVPB - Premix, 1 g, Intravenous, Q24H, Charlett Nose, RPH .  dextrose 5 % and 0.2 % NaCl  infusion, , Intravenous, Continuous, Flora Lipps, MD .  fentaNYL 2565mg in NS 2578m(1053mml) infusion-PREMIX, 10 mcg/hr, Intravenous, Continuous, MicCharlett NosePH .  lactulose ([facility] LLCnema 200 gm, 300 mL, Rectal, Q4H, RadGladstone LighterD .  midazolam (VERSED) injection 2-4 mg, 2-4 mg, Intravenous, Q1H PRN, MicCharlett NosePH .  multivitamins adult (MVI -12) 10 mL in dextrose 5% lactated ringers 1,000 mL infusion, , Intravenous, Q24H, KurMaretta Bees  Jermale Crass, MD .  octreotide (SANDOSTATIN) 500 mcg in sodium chloride 0.9 % 250 mL (2 mcg/mL) infusion, 25 mcg/hr, Intravenous, Continuous, Gladstone Lighter, MD, Last Rate: 12.5 mL/hr at 09/11/14 1332, 25 mcg/hr at 09/11/14 1332 .  olopatadine (PATANOL) 0.1 % ophthalmic solution 1 drop, 1 drop, Both Eyes, BID, Harrie Foreman, MD, 1 drop at 09/11/14 2130 .  [DISCONTINUED] ondansetron (ZOFRAN) tablet 4 mg, 4 mg, Oral, Q6H PRN **OR** ondansetron (ZOFRAN) injection 4 mg, 4 mg, Intravenous, Q6H PRN, Harrie Foreman, MD, 4 mg at 09/10/14 1052 .  pantoprazole (PROTONIX) injection 40 mg, 40 mg, Intravenous, Q12H, Gladstone Lighter, MD, 40 mg at 09/11/14 2130 .  sodium chloride 0.9 % injection 10 mL, 10 mL, Intravenous, Q12H, Gladstone Lighter, MD, 10 mL at 09/12/14 1000 .  sodium chloride 0.9 % injection 3 mL, 3 mL, Intravenous, Q12H, Harrie Foreman, MD, 3 mL at 09/11/14 2130  IMAGING    US Abdomen Complete  09/12/2014   CLINICAL DATA:  Cirrhosis  EXAM: ULTRASOUND ABDOMEN COMPLETE  COMPARISON:  None.  FINDINGS: Gallbladder: No gallstones or pericholecystic fluid is noted. Mild wall thickening is noted in parts of the gallbladder are related to the underlying ascites.  Common bile duct: Diameter: Varies in size from 6-4 mm.  Liver: Diffuse increased echogenicity is noted with some nodularity consistent with the given clinical history of cirrhosis.  IVC: No abnormality visualized.  Pancreas: Visualized portion unremarkable.  Spleen: Size and  appearance within normal limits.  Right Kidney: Length: 11.3 cm. Echogenicity within normal limits. No mass or hydronephrosis visualized.  Left Kidney: Length: 9.3 cm. Echogenicity within normal limits. No mass or hydronephrosis visualized.  Abdominal aorta: No aneurysm visualized.  Other findings: Mild ascites is noted.  IMPRESSION: Changes consistent with cirrhosis of the liver with mild ascites.  No other focal abnormality is noted.   Electronically Signed   By: Inez Catalina M.D.   On: 09/12/2014 09:50   Dg Chest Port 1 View  09/12/2014   CLINICAL DATA:  Intubation  EXAM: PORTABLE CHEST - 1 VIEW  COMPARISON:  Portable exam 0958 hours compared to 09/11/2014  FINDINGS: Tip of endotracheal tube projects 3.1 cm above carina.  RIGHT arm PICC line tip projects over SVC.  Enlargement of cardiac silhouette with slight vascular congestion.  Mediastinal contours normal.  Atelectasis in the mid lungs bilaterally.  No gross infiltrate, pleural effusion or pneumothorax.  IMPRESSION: Satisfactory endotracheal tube position.  Subsegmental atelectasis in the mid lungs bilaterally.   Electronically Signed   By: Lavonia Dana M.D.   On: 09/12/2014 10:05   Dg Chest Port 1 View  09/11/2014   CLINICAL DATA:  PICC readjustment  EXAM: PORTABLE CHEST - 1 VIEW  COMPARISON:  09/11/2014 at 1:20 p.m.  FINDINGS: Shortening of the right upper extremity PICC, tip near the upper cavoatrial junction.  Stable cardiomegaly and vascular pedicle widening.  Hypoventilation with few bandlike opacities, consistent with atelectasis. No edema, effusion, or pneumothorax.  IMPRESSION: 1. Shortened right PICC, tip near the upper cavoatrial junction. 2. Cardiomegaly. 3. Hypoventilation and mild basilar atelectasis.   Electronically Signed   By: Monte Fantasia M.D.   On: 09/11/2014 14:19   Dg Chest Port 1 View  09/11/2014   CLINICAL DATA:  Status post PICC line placement  EXAM: PORTABLE CHEST - 1 VIEW  COMPARISON:  09/09/2014  FINDINGS: Cardiac shadow  remains enlarged. A new right-sided PICC line is noted with the catheter tip at the superior right atrium. The lungs  are well aerated with mild bibasilar scarring stable from the prior exam. No bony abnormality is noted.  IMPRESSION: Status post PICC line placement in the superior right atrium   Electronically Signed   By: Inez Catalina M.D.   On: 09/11/2014 13:58      Indwelling Urinary Catheter continued, requirement due to   Reason to continue Indwelling Urinary Catheter for strict Intake/Output monitoring for hemodynamic instability   Central/PICC line continued, requirement due to   Reason to continue Central Line Monitoring of central venous pressure or other hemodynamic parameters   Ventilator continued, requirement due to, resp failure    Ventilator Sedation RASS 0 to -1      ASSESSMENT/PLAN   64 yo AAF admitted to ICU for Acute resp failure from acute hepatic encephalopathy with inability to protect airway in the setting of GIB and hep C   PULMONARY -Respiratory Failure-follow up ABg and CXR as needed -continue Full MV support -continue Bronchodilator Therapy -Wean Fio2 and PEEP as tolerated -will perform SAT/SBt when respiratory parameters are met   CARDIOVASCULAR Continue ICU montioring adn BP monitoring -Vasopressors if needed  RENAL -follow chem 7 -follow UO -continue Foley Catheter-assess need   GASTROINTESTINAL -GIB from varices -on octreotide and PPI -follow GI recs  HEMATOLOGIC -follow h/h  INFECTIOUS -emperic abx theraoy for varcieal bleed with rocephin  ENDOCRINE - ICU hypoglycemic\Hyperglycemia protocol   NEUROLOGIC- - intubated and sedated - minimal sedation to achieve a RASS goal: -1 -lactulose enemas     I have personally obtained a history, examined the patient, evaluated laboratory and imaging results, formulated the assessment and plan and placed orders.  The Patient requires high complexity decision making for assessment  and support, frequent evaluation and titration of therapies, application of advanced monitoring technologies and extensive interpretation of multiple databases. Critical Care Time devoted to patient care services described in this note is 45  minutes.   Overall, patient is critically ill, prognosis is guarded. Patient at high risk for cardiac arrest and death.   Corrin Parker, M.D. Pulmonary & [place] Director Intensive Care Unit   09/12/2014, 11:24 AM

## 2014-09-12 NOTE — Progress Notes (Signed)
Dr. Wiliam Ke paged and notified that patient unable to hold lactulose enema.  Also when laying patient flat and onto left side patient started coughing and now has moderate amount of bright red blood coming from ETT. Dr. Wiliam Ke acknowledged information and gave telephone order to check Hgb.

## 2014-09-12 NOTE — Progress Notes (Signed)
Prior to placing rectal tube fleets enema given and large amounts of hard brown stool evacuated. Dr. Wiliam Ke present.

## 2014-09-12 NOTE — Progress Notes (Addendum)
Came to evaluate patient again as she was having some bleeding through ETT. Patient is intubated and sedated. Patient is sedated on fentanyl, fresh blood through ETT notedd. All labs ordered again- cbc, pt, ptt, cmp If INR elevated- will give FFP, if hb<7.5- will transfuse  Discussed case with Dr. Julieta Gutting- Liver specialist at [facility] They have reviewed the case and recommended to place dobhoff tube and start oral lactulose- did say that they would not do anything differently over there as well. Cont octreotide drip- increase drip rate. Cont to monitor in ICU Case discussed with Dr. Bluford Kaufmann, GI here as well  Also Dr. Shelle Iron notified- of potential bleeding and need for repeat EGD in case and is aware.  Additional critical care time spent: 40 min  Patient is getting fluoroscopic guided dobhoff placed by radiology now and then will start oral lactulose.

## 2014-09-12 NOTE — Progress Notes (Signed)
Initial Nutrition Assessment    INTERVENTION:   Coordination of Care: discussed nutritional poc during ICU rounds, pt without OG/NG access, no plan to place one at this time. Do not recommend initiation of TPN, per MD Kasa hoping to wean in the next few days. Will continue to assess.   NUTRITION DIAGNOSIS:  Inadequate oral intake related to acute illness as evidenced by NPO status.    GOAL:  Provide needs based on ASPEN/SCCM guidelines    MONITOR:   (Energy Intake, Anthropometrics, Digestive System, Electrolyte/Renal Profile)  REASON FOR ASSESSMENT:  Ventilator    ASSESSMENT:  Pt admitted with GI bleed, vomitting blood; s/p banding of esophageal varices; pt with acute respiratory failure with mental status changes, intubated this AM  PMHx:  Past Medical History  Diagnosis Date  . Hypertension   . A-fib   . PAD (peripheral artery disease)     Current Nutrition: NPO   Food/Nutrition-Related History: unable to assess  Digestive System: no OG or NG due to recent banding per MD, abdomen distended, mild ascites, +BM  Medications: D5-1/2NS at 75 ml/hr, MVI, fentanyl, versed pushes, octreotide, lactulose  Electrolyte/Renal Profile and Glucose Profile:   Recent Labs Lab 09/09/14 2201 09/11/14 0608 09/11/14 1446 09/12/14 0523  NA 135 149*  --  147*  K 5.5* 6.4* 4.9 4.6  CL 110 124*  --  117*  CO2 21* 17*  --  21*  BUN 26* 41*  --  50*  CREATININE 0.98 0.86  --  1.07*  CALCIUM 7.9* 9.0  --  8.8*  GLUCOSE 203* 115*  --  155*   Protein Profile:   Recent Labs Lab 09/09/14 2210  ALBUMIN 3.0*   Hepatic Function Latest Ref Rng 09/09/2014  Total Protein 6.5 - 8.1 g/dL 5.2(L)  Albumin 3.5 - 5.0 g/dL 3.0(L)  AST 15 - 41 U/L 40  ALT 14 - 54 U/L 16  Alk Phosphatase 38 - 126 U/L 105  Total Bilirubin 0.3 - 1.2 mg/dL 1.1  Bilirubin, Direct 0.1 - 0.5 mg/dL 0.3    Ammonia: 354  Gastrointestinal Profile: Patient Vitals for the past 24 hrs:  Stool Color   09/12/14 1025 Brown  09/12/14 0008 Brown  09/11/14 1841 Brown     Nutrition-Focused Physical Exam Findings:  Unable to complete Nutrition-Focused physical exam at this time.   Height:  Ht Readings from Last 1 Encounters:  09/10/14 '5\' 7"'  (1.702 m)    Weight:  Wt Readings from Last 1 Encounters:  09/12/14 182 lb 4.8 oz (82.691 kg)    Filed Weights   09/10/14 0328 09/11/14 0548 09/12/14 0500  Weight: 180 lb 1.9 oz (81.7 kg) 186 lb 1.6 oz (84.414 kg) 182 lb 4.8 oz (82.691 kg)    BMI:  Body mass index is 28.55 kg/(m^2).  Estimated Nutritional Needs:  Kcal:  1740 kcals (BEE 1578, Tmax: 37, Ve: 8.3)   Protein:  125-166 g (1.5-2.0 g/kg)   Fluid:  2075-2490 mL (25-30 ml/kg)      Intake/Output Summary (Last 24 hours) at 09/12/14 1323 Last data filed at 09/12/14 0700  Gross per 24 hour  Intake    216 ml  Output   1550 ml  Net  -1334 ml    HIGH Care Level  Kerman Passey MS, RD, LDN [phone] Pager

## 2014-09-12 NOTE — Progress Notes (Signed)
PT Cancellation Note  Patient Details Name: Robin Schneider MRN: [id number] DOB: 10/18/1950   Cancelled Treatment:    Reason Eval/Treat Not Completed: Medical issues which prohibited therapy (Per continued chart review, patient now with transfer to CCU due to decline in medical status.  Per policy, will require new orders due to change in status and transfer to higher level of care.  Please re-consult as medically appropriate.)   Kalmen Lollar H. Manson Passey, PT, DPT, NCS 09/12/2014, 10:09 AM [phone]

## 2014-09-12 NOTE — Care Management Note (Signed)
Case Management Note  Patient Details  Name: Robin Schneider MRN: [id number] Date of Birth: 1950-04-17  Subjective/Objective:   Transferred from 2C. Elevated ammonia level at 364. More lethargic, less responsive. Acute respiratory failure, intubated, fentanyl gtt and PRN versed.                   Action/Plan:   Expected Discharge Date:                  Expected Discharge Plan:     In-House Referral:     Discharge planning Services     Post Acute Care Choice:    Choice offered to:     DME Arranged:    DME Agency:     HH Arranged:    HH Agency:     Status of Service:     Medicare Important Message Given:    Date Medicare IM Given:    Medicare IM give by:    Date Additional Medicare IM Given:    Additional Medicare Important Message give by:     If discussed at Long Length of Stay Meetings, dates discussed:    Additional Comments:  Marily Memos, RN 09/12/2014, 1:11 PM

## 2014-09-12 NOTE — H&P (Signed)
Pt being transferred to ICU.  Report called to Fleet Contras, Charity fundraiser.  Pt currently in Ultrasound Procedure ([place]) at this time.  Pt will be transported from [place] to ICU per MD request.

## 2014-09-12 NOTE — Progress Notes (Signed)
Dr. Nemiah Commander at bedside and has given order to intubated patient.

## 2014-09-12 NOTE — Progress Notes (Addendum)
Ct head unremarkable, keppra loading complete Repeat tonic clonic seizure -- ativan 4mg  given after about , seizure broke Start ativan gtt for sedation/seizure control Can wean off fentanyl gtt, added fentanyl prn for pain  Will need repeat ct head in 24 hours versus mri if feasible Will check EEG in AM, question if she has been having subclinical seizures all along  ---------------------------------------------------------------- Repeat seizure while awaiting ativan gtt Re-bolus 4mg  ativan Add phenobarb 5mg /kg administer over 

## 2014-09-12 NOTE — Progress Notes (Signed)
All jewelry mentioned below given to [employer]. [place] (husband). Yellow metal ring with purple heart stone in center and multiple small clear stones on both sides of purple heart stone.  Yellow metal band and yellow metal wedding band with round clear stone in middle and triangle shaped clear stones on each side.  Yellow metal bracelet.

## 2014-09-12 NOTE — Progress Notes (Addendum)
[facility] [place] at [facility]   PATIENT NAME: Robin Schneider    MR#:  [id number]  DATE OF BIRTH:  06/23/1950  SUBJECTIVE:  CHIEF COMPLAINT:   Chief Complaint  Patient presents with  . Hemoptysis  . Weakness   - Patient admitted for hematemesis, had EGD done 09/10/14 and banding of esophageal varices. -Hemoglobin with some drop noted, spitting blood last night, none now - more lethargic  And ammonia is elevated- rectal lactulose given last night and small hard stool came out once -ammonia much elevated and patient more lethargic now - moving to ICU and possible intubation  REVIEW OF SYSTEMS:  Review of Systems  Unable to perform ROS: mental status change    DRUG ALLERGIES:   Allergies  Allergen Reactions  . Morphine And Related Rash  . Percocet [Oxycodone-Acetaminophen] Rash    VITALS:  Blood pressure 128/56, pulse 85, temperature 98.3 F (36.8 C), temperature source Oral, resp. rate 20, height  (1.702 m), weight 82.691 kg (182 lb 4.8 oz), SpO2 100 %.  PHYSICAL EXAMINATION:  Physical Exam  GENERAL:  64 y.o.-year-old patient lying in the bed , very lethargic.  EYES: Pupils equal, round, reactive to light and accommodation- sluggish reaction to light though. No scleral icterus. Extraocular muscles intact.  HEENT: Head atraumatic, normocephalic. Oropharynx and nasopharynx clear.  NECK:  Supple, no jugular venous distention. No thyroid enlargement, no tenderness.  LUNGS: Normal breath sounds bilaterally, no wheezing, rales,rhonchi or crepitation. Decreased bibasilar breath sounds. No use of accessory muscles of respiration.  CARDIOVASCULAR: S1, S2 normal. No murmurs, rubs, or gallops.  ABDOMEN: Soft, nontender, distended. Bowel sounds present. No organomegaly or mass.  EXTREMITIES: No pedal edema, cyanosis, or clubbing.  NEUROLOGIC: lethargic, unable to follow any commands Is very confused. Husband, daughter, at bedside. PSYCHIATRIC:  The patient is not  alert or oriented.  SKIN: No obvious rash, lesion, or ulcer.    LABORATORY PANEL:   CBC  Recent Labs Lab 09/12/14 0523  WBC 12.1*  HGB 8.1*  HCT 26.1*  PLT 103*   ------------------------------------------------------------------------------------------------------------------  Chemistries   Recent Labs Lab 09/09/14 2210  09/12/14 0523  NA  --   < > 147*  K  --   < > 4.6  CL  --   < > 117*  CO2  --   < > 21*  GLUCOSE  --   < > 155*  BUN  --   < > 50*  CREATININE  --   < > 1.07*  CALCIUM  --   < > 8.8*  AST 40  --   --   ALT 16  --   --   ALKPHOS 105  --   --   BILITOT 1.1  --   --   < > = values in this interval not displayed. ------------------------------------------------------------------------------------------------------------------  Cardiac Enzymes No results for input(s): TROPONINI in the last 168 hours. ------------------------------------------------------------------------------------------------------------------  RADIOLOGY:  Dg Chest Port 1 View  09/11/2014   CLINICAL DATA:  PICC readjustment  EXAM: PORTABLE CHEST - 1 VIEW  COMPARISON:  09/11/2014 at 1:20 p.m.  FINDINGS: Shortening of the right upper extremity PICC, tip near the upper cavoatrial junction.  Stable cardiomegaly and vascular pedicle widening.  Hypoventilation with few bandlike opacities, consistent with atelectasis. No edema, effusion, or pneumothorax.  IMPRESSION: 1. Shortened right PICC, tip near the upper cavoatrial junction. 2. Cardiomegaly. 3. Hypoventilation and mild basilar atelectasis.   Electronically Signed   By: Marnee Spring  M.D.   On: 09/11/2014 14:19   Dg Chest Port 1 View  09/11/2014   CLINICAL DATA:  Status post PICC line placement  EXAM: PORTABLE CHEST - 1 VIEW  COMPARISON:  09/09/2014  FINDINGS: Cardiac shadow remains enlarged. A new right-sided PICC line is noted with the catheter tip at the superior right atrium. The lungs are well aerated with mild  bibasilar scarring stable from the prior exam. No bony abnormality is noted.  IMPRESSION: Status post PICC line placement in the superior right atrium   Electronically Signed   By: Alcide Clever M.D.   On: 09/11/2014 13:58    EKG:   Orders placed or performed during the hospital encounter of 09/09/14  . ED EKG  . ED EKG  . EKG 12-Lead  . EKG 12-Lead    ASSESSMENT AND PLAN:   64y/o AAF with PMH of Hep C, Afib on asa, HTN, PAD admitted for Hematemesis.  * GI bleed- variceal upper GI bleed.  - Hemoglobin drop noted however likely dilutional. No indication for any transfusion at this time. - GI consulted - EGD with esophageal varices and bands have been placed. on octreotide drip -Continue IV Protonix twice a day at this time. - BP is stable and no active bleeding - Transfuse if hb <7.5  *. Hepatic encephalopathy- ammonia very elevated - increase frequency of rectal lactulose enemas as not comfortable with NG tube and giving oral lactulose due to recent banding and GI bleed - GI consulted- called Dr. Bluford Kaufmann and awaiting call back - monitor closely. - will move to ICU and possible intubation  * Varices-? Possible underlying liver disease - was told that she has Hep C in [place] several years ago, last year at [facility] was told that she doesn't have Hep C - hepatitis panel sent - likely has underlying liver disease  * HTN- BP stable, hold aldactone  * Hyperkalemia- likely from aldactone, however Aldactone has been discontinued yesterday. -Insulin dextrose, Kayexalate have been ordered and repeat potassium normalised  * Afib and junctional rhythm- known chronic history, hold asa with GI bleed - Rate is controlled, not on any meds  * DVT prophylaxis- cont SQ heparin for now   All the records are reviewed and case discussed with Care Management/Social Workerr. Management plans discussed with the patient, family and they are in agreement.  CODE STATUS: FULL CODE  TOTAL CRITICAL CARE  TIME SPENT IN TAKING CARE OF THIS PATIENT: 45 minutes.   POSSIBLE D/C IN 2-3 DAYS, DEPENDING ON CLINICAL CONDITION.   Enid Baas M.D on 09/12/2014 at 8:33 AM  Between 7am to 6pm - Pager - [phone]  After 6pm go to [email or web address] - password [email or web address] [facility]  [facility]  Office  [phone]  CC: Primary care physician; Leim Fabry, MD

## 2014-09-12 NOTE — Progress Notes (Signed)
Husband signed consent for dobb Hoff to be placed under fluoroscopy.

## 2014-09-12 NOTE — Progress Notes (Signed)
Pt accepted from floor.  Upon initial assessment patient was unresponsive to sternal rub.  Dr. Jennye Boroughs notified.

## 2014-09-12 NOTE — Progress Notes (Signed)
Dobb hoff placed without complication.  Patient returned to room and all monitors in place.

## 2014-09-13 ENCOUNTER — Inpatient Hospital Stay: Payer: MEDICAID

## 2014-09-13 DIAGNOSIS — G40901 Epilepsy, unspecified, not intractable, with status epilepticus: Secondary | ICD-10-CM

## 2014-09-13 DIAGNOSIS — B1920 Unspecified viral hepatitis C without hepatic coma: Secondary | ICD-10-CM

## 2014-09-13 DIAGNOSIS — D649 Anemia, unspecified: Secondary | ICD-10-CM

## 2014-09-13 DIAGNOSIS — N179 Acute kidney failure, unspecified: Secondary | ICD-10-CM

## 2014-09-13 LAB — PROTIME-INR
INR: 2.1
Prothrombin Time: 23.7 seconds — ABNORMAL HIGH (ref 11.4–15.0)

## 2014-09-13 LAB — GLUCOSE, CAPILLARY
Glucose-Capillary: 120 mg/dL — ABNORMAL HIGH (ref 65–99)
Glucose-Capillary: 160 mg/dL — ABNORMAL HIGH (ref 65–99)
Glucose-Capillary: 181 mg/dL — ABNORMAL HIGH (ref 65–99)

## 2014-09-13 LAB — BLOOD GAS, ARTERIAL
ACID-BASE DEFICIT: 2.6 mmol/L — AB (ref 0.0–2.0)
ACID-BASE DEFICIT: 18.4 mmol/L — AB (ref 0.0–2.0)
Acid-base deficit: 3.4 mmol/L — ABNORMAL HIGH (ref 0.0–2.0)
Acid-base deficit: 14.9 mmol/L — ABNORMAL HIGH (ref 0.0–2.0)
Allens test (pass/fail): POSITIVE — AB
Allens test (pass/fail): POSITIVE — AB
Bicarbonate: 20.2 mEq/L — ABNORMAL LOW (ref 21.0–28.0)
Bicarbonate: 20.1 mEq/L — ABNORMAL LOW (ref 21.0–28.0)
Bicarbonate: 11.6 mEq/L — ABNORMAL LOW (ref 21.0–28.0)
Bicarbonate: 10.2 mEq/L — ABNORMAL LOW (ref 21.0–28.0)
FIO2: 0.28 %
FIO2: 40 %
FIO2: 0.4 %
FIO2: 0.4 %
LHR: 18 {breaths}/min
LHR: 30 {breaths}/min
MECHANICAL RATE: 18
MECHVT: 350 mL
Mechanical Rate: 18
O2 SAT: 99.4 %
O2 SAT: 91.2 %
O2 SAT: 87.6 %
O2 Saturation: 97.3 %
PATIENT TEMPERATURE: 37
PCO2 ART: 29 mmHg — AB (ref 32.0–48.0)
PEEP: 5 cmH2O
PEEP: 5 cmH2O
PH ART: 7.42 (ref 7.350–7.450)
PH ART: 7.21 — AB (ref 7.350–7.450)
PO2 ART: 75 mmHg — AB (ref 83.0–108.0)
PO2 ART: 74 mmHg — AB (ref 83.0–108.0)
Patient temperature: 37
Patient temperature: 37
Patient temperature: 37
RATE: 30 resp/min
VT: 450 mL
VT: 450 mL
pCO2 arterial: 29 mmHg — ABNORMAL LOW (ref 32.0–48.0)
pCO2 arterial: 31 mmHg — ABNORMAL LOW (ref 32.0–48.0)
pCO2 arterial: 33 mmHg (ref 32.0–48.0)
pH, Arterial: 7.45 (ref 7.350–7.450)
pH, Arterial: 7.1 — CL (ref 7.350–7.450)
pO2, Arterial: 147 mmHg — ABNORMAL HIGH (ref 83.0–108.0)
pO2, Arterial: 92 mmHg (ref 83.0–108.0)

## 2014-09-13 LAB — CBC
HEMATOCRIT: 25.6 % — AB (ref 35.0–47.0)
Hemoglobin: 7.9 g/dL — ABNORMAL LOW (ref 12.0–16.0)
MCH: 24.4 pg — ABNORMAL LOW (ref 26.0–34.0)
MCHC: 30.8 g/dL — ABNORMAL LOW (ref 32.0–36.0)
MCV: 79.2 fL — ABNORMAL LOW (ref 80.0–100.0)
Platelets: 125 10*3/uL — ABNORMAL LOW (ref 150–440)
RBC: 3.23 MIL/uL — AB (ref 3.80–5.20)
RDW: 17.6 % — AB (ref 11.5–14.5)
WBC: 19.8 10*3/uL — AB (ref 3.6–11.0)

## 2014-09-13 LAB — BASIC METABOLIC PANEL
ANION GAP: 13 (ref 5–15)
BUN: 67 mg/dL — ABNORMAL HIGH (ref 6–20)
BUN: 71 mg/dL — AB (ref 6–20)
CALCIUM: 8.2 mg/dL — AB (ref 8.9–10.3)
CHLORIDE: 115 mmol/L — AB (ref 101–111)
CO2: 18 mmol/L — ABNORMAL LOW (ref 22–32)
CO2: 14 mmol/L — ABNORMAL LOW (ref 22–32)
Calcium: 8.3 mg/dL — ABNORMAL LOW (ref 8.9–10.3)
Chloride: 119 mmol/L — ABNORMAL HIGH (ref 101–111)
Creatinine, Ser: 1.71 mg/dL — ABNORMAL HIGH (ref 0.44–1.00)
Creatinine, Ser: 2.04 mg/dL — ABNORMAL HIGH (ref 0.44–1.00)
GFR calc Af Amer: 36 mL/min — ABNORMAL LOW (ref >60)
GFR calc Af Amer: 29 mL/min — ABNORMAL LOW (ref >60)
GFR calc non Af Amer: 31 mL/min — ABNORMAL LOW (ref >60)
GFR, EST NON AFRICAN AMERICAN: 25 mL/min — AB (ref >60)
GLUCOSE: 141 mg/dL — AB (ref 65–99)
Glucose, Bld: 261 mg/dL — ABNORMAL HIGH (ref 65–99)
POTASSIUM: 4.4 mmol/L (ref 3.5–5.1)
Potassium: 4.8 mmol/L (ref 3.5–5.1)
Sodium: 150 mmol/L — ABNORMAL HIGH (ref 135–145)

## 2014-09-13 LAB — AMMONIA: AMMONIA: 570 umol/L — AB (ref 9–35)

## 2014-09-13 LAB — HEPATITIS C GENOTYPE

## 2014-09-13 LAB — LACTIC ACID, PLASMA: Lactic Acid, Venous: 6.2 mmol/L (ref 0.5–2.0)

## 2014-09-13 LAB — MAGNESIUM: Magnesium: 2.6 mg/dL — ABNORMAL HIGH (ref 1.7–2.4)

## 2014-09-13 LAB — HCV RNA QUANT: HCV Quantitative: NOT DETECTED IU/mL (ref >50)

## 2014-09-13 LAB — OCCULT BLOOD X 1 CARD TO LAB, STOOL: FECAL OCCULT BLD: POSITIVE — AB

## 2014-09-13 LAB — PHOSPHORUS: PHOSPHORUS: 5.3 mg/dL — AB (ref 2.5–4.6)

## 2014-09-13 LAB — HEMOGLOBIN: Hemoglobin: 8.5 g/dL — ABNORMAL LOW (ref 12.0–16.0)

## 2014-09-13 LAB — TRIGLYCERIDES: TRIGLYCERIDES: 201 mg/dL — AB (ref <150)

## 2014-09-13 MED ORDER — PANTOPRAZOLE SODIUM 40 MG IV SOLR: 40.0000 mg | Freq: Two times a day (BID) | INTRAVENOUS | Status: AC

## 2014-09-13 MED ORDER — DEXTROSE-NACL 5-0.2 % IV SOLN: 60.0000 mL/h | INTRAVENOUS | Status: AC

## 2014-09-13 MED ORDER — INSULIN ASPART 100 UNIT/ML ~~LOC~~ SOLN: 0.0000 [IU] | SUBCUTANEOUS | Status: AC

## 2014-09-13 MED ORDER — NOREPINEPHRINE 4 MG/250ML-% IV SOLN: 0.0000 ug/min | INTRAVENOUS | Status: AC

## 2014-09-13 MED ORDER — LACTULOSE 10 GM/15ML PO SOLN: 30.0000 g | ORAL | Status: AC

## 2014-09-13 MED ORDER — CEFTRIAXONE SODIUM IN DEXTROSE 20 MG/ML IV SOLN
1.0000 g | INTRAVENOUS | Status: DC
  Administered 2014-09-13: 1 g via INTRAVENOUS
  Filled 2014-09-13 (×2): qty 50

## 2014-09-13 MED ORDER — VASOPRESSIN 20 UNIT/ML IV SOLN
0.0300 [IU]/min | INTRAVENOUS | Status: DC
  Filled 2014-09-13: qty 2

## 2014-09-13 MED ORDER — LACTULOSE 10 GM/15ML PO SOLN: 30.0000 g | ORAL | Status: DC

## 2014-09-13 MED ORDER — FENTANYL 2500MCG IN NS 250ML (10MCG/ML) PREMIX INFUSION: 10.0000 ug/h | INTRAVENOUS | Status: AC

## 2014-09-13 MED ORDER — ONDANSETRON HCL 4 MG/2ML IJ SOLN: 4.0000 mg | Freq: Four times a day (QID) | INTRAMUSCULAR | Status: AC | PRN

## 2014-09-13 MED ORDER — VECURONIUM BROMIDE 10 MG IV SOLR
INTRAVENOUS | Status: AC
  Filled 2014-09-13: qty 10

## 2014-09-13 MED ORDER — PENTOBARBITAL SODIUM 50 MG/ML IJ SOLN: 1.0000 mg/kg/h | INTRAVENOUS | Status: AC

## 2014-09-13 MED ORDER — M.V.I. ADULT IV INJ: 60.0000 mL/h | INTRAVENOUS | Status: AC

## 2014-09-13 MED ORDER — MIDAZOLAM HCL 5 MG/ML IJ SOLN
1.0000 mg/h | INTRAMUSCULAR | Status: DC
  Filled 2014-09-13: qty 10

## 2014-09-13 MED ORDER — SODIUM CHLORIDE 0.9 % IV SOLN: 25.0000 ug/h | INTRAVENOUS | Status: AC

## 2014-09-13 MED ORDER — CEFTRIAXONE SODIUM IN DEXTROSE 20 MG/ML IV SOLN: 1.0000 g | INTRAVENOUS | Status: AC

## 2014-09-13 MED ORDER — MIDAZOLAM 50MG/50ML (1MG/ML) PREMIX INFUSION
1.0000 mg/h | INTRAVENOUS | Status: DC
  Administered 2014-09-13: 1 mg/h via INTRAVENOUS
  Filled 2014-09-13: qty 50

## 2014-09-13 MED ORDER — PROPOFOL 1000 MG/100ML IV EMUL
INTRAVENOUS | Status: AC
  Administered 2014-09-13: 80 ug/kg/min via INTRAVENOUS
  Filled 2014-09-13: qty 100

## 2014-09-13 MED ORDER — SODIUM CHLORIDE 0.9 % IJ SOLN: 3.0000 mL | Freq: Two times a day (BID) | INTRAMUSCULAR | Status: AC

## 2014-09-13 MED ORDER — FENTANYL CITRATE (PF) 100 MCG/2ML IJ SOLN: 100.0000 ug | INTRAMUSCULAR | Status: AC | PRN

## 2014-09-13 MED ORDER — PENTOBARBITAL SODIUM 50 MG/ML IJ SOLN
1.0000 mg/kg/h | INTRAVENOUS | Status: DC
  Administered 2014-09-13: 1 mg/kg/h via INTRAVENOUS
  Filled 2014-09-13: qty 50

## 2014-09-13 MED ORDER — SODIUM BICARBONATE 8.4 % IV SOLN: 50.0000 meq | Freq: Once | INTRAVENOUS | Status: DC

## 2014-09-13 MED ORDER — PROPOFOL 1000 MG/100ML IV EMUL: 5.0000 ug/kg/min | INTRAVENOUS | Status: AC

## 2014-09-13 MED ORDER — PROPOFOL 1000 MG/100ML IV EMUL
5.0000 ug/kg/min | INTRAVENOUS | Status: DC
  Administered 2014-09-13: 70.335 ug/kg/min via INTRAVENOUS
  Administered 2014-09-13: 75 ug/kg/min via INTRAVENOUS
  Administered 2014-09-13 (×4): 80 ug/kg/min via INTRAVENOUS
  Filled 2014-09-13 (×5): qty 100

## 2014-09-13 MED ORDER — VECURONIUM BROMIDE 10 MG IV SOLR
10.0000 mg | Freq: Once | INTRAVENOUS | Status: AC
  Administered 2014-09-13: 10 mg via INTRAVENOUS

## 2014-09-13 MED ORDER — STERILE WATER FOR INJECTION IV SOLN
INTRAVENOUS | Status: DC
  Filled 2014-09-13 (×4): qty 850

## 2014-09-13 MED ORDER — NOREPINEPHRINE 4 MG/250ML-% IV SOLN
0.0000 ug/min | INTRAVENOUS | Status: DC
  Administered 2014-09-13: 3 ug/min via INTRAVENOUS
  Filled 2014-09-13 (×2): qty 250

## 2014-09-13 MED ORDER — PROPOFOL 1000 MG/100ML IV EMUL
2.0000 mg/kg/h | INTRAVENOUS | Status: DC
  Administered 2014-09-13: 10 mg/kg/h via INTRAVENOUS

## 2014-09-13 MED ORDER — LACTULOSE ENEMA: 300.0000 mL | ORAL | Status: AC

## 2014-09-13 MED ORDER — LORAZEPAM 2 MG/ML IJ SOLN: 1.0000 mg/h | INTRAVENOUS | Status: AC

## 2014-09-13 MED ORDER — SODIUM CHLORIDE 0.9 % IJ SOLN: 10.0000 mL | Freq: Two times a day (BID) | INTRAMUSCULAR | Status: AC

## 2014-09-13 MED ORDER — PENTOBARBITAL LOAD VIA INFUSION
5.0000 mg/kg | Freq: Once | INTRAVENOUS | Status: AC
  Administered 2014-09-13: 413.5 mg via INTRAVENOUS

## 2014-09-13 MED ORDER — RIFAXIMIN 550 MG PO TABS: 550.0000 mg | ORAL_TABLET | Freq: Two times a day (BID) | ORAL | Status: AC

## 2014-09-13 MED ORDER — NOREPINEPHRINE BITARTRATE 1 MG/ML IV SOLN
0.0000 ug/min | INTRAVENOUS | Status: DC
  Administered 2014-09-13: 3 ug/min via INTRAVENOUS

## 2014-09-13 MED ORDER — STERILE WATER FOR INJECTION IJ SOLN
INTRAMUSCULAR | Status: AC
  Filled 2014-09-13: qty 10

## 2014-09-13 MED ORDER — RIFAXIMIN 550 MG PO TABS
550.0000 mg | ORAL_TABLET | Freq: Two times a day (BID) | ORAL | Status: DC
  Administered 2014-09-13: 550 mg via ORAL
  Filled 2014-09-13: qty 1

## 2014-09-13 MED ORDER — MIDAZOLAM HCL 2 MG/2ML IJ SOLN: 2.0000 mg | INTRAMUSCULAR | Status: AC | PRN

## 2014-09-13 NOTE — Progress Notes (Signed)
Gave report to [place] at [facility]- patient to transfer to room 13- ABG worsening- pH 7.10- lactic acid at 6.2-Dr. Belia Heman made aware- [facility] RN Janette on phone with [facility] to decided plan of care for patient at this moment.

## 2014-09-13 NOTE — Discharge Summary (Signed)
[facility] - [place] at [facility]   PATIENT NAME: Robin Schneider    MR#:  [id number]  DATE OF BIRTH:  [birth year]  DATE OF ADMISSION:  09/09/2014 ADMITTING PHYSICIAN: Arnaldo Natal, MD  DATE OF DISCHARGE: 09/13/14  PRIMARY CARE PHYSICIAN: ALDRIDGE,BARBARA, MD    ADMISSION DIAGNOSIS:  Hematemesis [K92.0] Acute upper GI hemorrhage [K92.2]  DISCHARGE DIAGNOSIS:  Principal Problem:   Hematemesis Active Problems:   Acute respiratory failure   Encephalopathy, hepatic   Status epilepticus   Hepatitis C   ARF (acute renal failure)   Anemia   SECONDARY DIAGNOSIS:   Past Medical History  Diagnosis Date  . Hypertension   . A-fib   . PAD (peripheral artery disease)     HOSPITAL COURSE:   64y/o AAF with PMH of Hep C, Afib on asa, HTN, PAD admitted for Hematemesis on 09/09/14 night.  * GI bleed- variceal upper GI bleed.  - Hemoglobin now at 7.9, - EGD on 09/10/14 with esophageal varices and bands have been placed. on octreotide drip since then. - also has dobhoff tube in place since 09/12/14 -Continue IV Protonix twice a day at this time. - will transfuse with 1unit today as hb at 7.9, admission hb of 10. Slow drop noted, was at 8.7 yesterday. - Empirically on rocephin  *. Hepatic encephalopathy- ammonia very elevated, >570 today - on rectal lactulose and oral lactulose through dobhoff tube - on rifaximin as well, no significant improvement- actually worsening. - no bowel movements so far - GI on board - monitor closely.  * Acute respiratory Failure- intubated for airway protection - appreciate Dr. Clovis Fredrickson input - sedation with propofol  * Generalized tonic clonic seizures- secondary to hyperammonemia - CT head from 09/13/14 normal, no loss of gray-white mater differentiation - EEG today, neurologist notified- don't have continuous EEG monitoring. - cont phenobarb and ativan drips- - if continues to have myoclonic jerks- give  ativan  bolus and continue drip - If no improvement- will load with 400 mg IV Vimpat and continue 200 mg IV BID vimpat  * Liver cirrhosis- pt and family not aware of diagnosis initially- now know. - remote history of alc use and also has hepatitis C - had outpt MRI of liver in jan-feb 2016,  and EGD done few months ago for the same - US liver here with again cirrhosis.  * Shock- with low BP, IV fluids - also on levophed at  * ARF- ATN from hypotension and also could be hepatorenal syndrome - [place] abd yesterday with normal kidneys. Foley placed and reasonable urine output for now.  * Afib and junctional rhythm- known chronic history, hold asa with GI bleed - Rate is controlled, not on any meds  * DVT prophylaxis- cont SQ heparin for now  Patient is critically ill and high risk for cardioresp arrest- family aware.  DISCHARGE CONDITIONS:   1. GI consultation by Dr. Bluford Kaufmann 2. Pulmonary critical care consultation by Dr. Belia Heman 3. Neurology consultation by Dr. Mellody Drown  CONSULTS OBTAINED:  Treatment Team:  Wallace Cullens, MD  DRUG ALLERGIES:   Allergies  Allergen Reactions  . Morphine And Related Rash  . Percocet [Oxycodone-Acetaminophen] Rash    DISCHARGE MEDICATIONS:   Current Discharge Medication List    START taking these medications   Details  cefTRIAXone (ROCEPHIN) 20 MG/ML IVPB Inject 50 mLs (1 g total) into the vein daily. Qty: 50 mL    Dextrose-Sodium Chloride (DEXTROSE 5 % AND 0.2 %  NACL) 5-0.2 % Inject 60 mL/hr into the vein continuous.    fentaNYL (SUBLIMAZE) 100 MCG/2ML injection Inject 2 mLs (100 mcg total) into the vein every hour as needed for severe pain. Qty: 2 mL, Refills: 0    fentaNYL 10 mcg/ml SOLN infusion Inject 10 mcg/hr into the vein continuous.    insulin aspart (NOVOLOG) 100 UNIT/ML injection Inject 0-15 Units into the skin every 4 (four) hours. Qty: 10 mL, Refills: 11    lactulose (CHRONULAC) 10 GM/15ML SOLN enema Place 300 mLs rectally every 4  (four) hours.    lactulose (CHRONULAC) 10 GM/15ML solution Take 45 mLs (30 g total) by mouth every 2 (two) hours. Qty: 240 mL, Refills: 0    LORazepam 50 mg in dextrose 5 % 25 mL Inject 1-10 mg/hr into the vein continuous.    midazolam (VERSED) 2 MG/2ML SOLN injection Inject 2-4 mLs (2-4 mg total) into the vein every hour as needed for agitation or sedation. Qty: 2.5 mL, Refills: 0    multivitamins adult 10 mL in dextrose 5% lactated ringers 5 % 1,000 mL Inject 60 mL/hr into the vein daily.    norepinephrine (LEVOPHED) 4mg /260mL SOLN Inject 0-0.04 mg/min into the vein continuous.    octreotide 500 mcg in sodium chloride 0.9 % 250 mL Inject 25 mcg/hr into the vein continuous.    ondansetron (ZOFRAN) 4 MG/2ML SOLN injection Inject 2 mLs (4 mg total) into the vein every 6 (six) hours as needed for nausea. Qty: 2 mL, Refills: 0    pantoprazole (PROTONIX) 40 MG injection Inject 40 mg into the vein every 12 (twelve) hours. Qty: 1 each    PENTobarbital 2,500 mg in dextrose 5 % 250 mL Inject 82.7-413.5 mg/hr into the vein continuous.    propofol (DIPRIVAN) 1000 MG/100ML EMUL injection Inject 413.5-6,616 mcg/min into the vein continuous.    rifaximin (XIFAXAN) 550 MG TABS tablet Take 1 tablet (550 mg total) by mouth 2 (two) times daily. Qty: 60 tablet, Refills: 0    !! sodium chloride 0.9 % injection Inject 3 mLs into the vein every 12 (twelve) hours. Qty: 5 mL, Refills: 0    !! sodium chloride 0.9 % injection Inject 10 mLs into the vein every 12 (twelve) hours. Qty: 5 mL, Refills: 0     !! - Potential duplicate medications found. Please discuss with provider.    CONTINUE these medications which have NOT CHANGED   Details  albuterol (VENTOLIN HFA) 108 (90 BASE) MCG/ACT inhaler Inhale 2 puffs into the lungs every 4 (four) hours as needed for wheezing or shortness of breath.     olopatadine (PATANOL) 0.1 % ophthalmic solution Apply 1 drop to eye 2 (two) times daily.      STOP taking  these medications     albuterol (ACCUNEB) 1.25 MG/3ML nebulizer solution      aspirin EC 81 MG tablet      diazepam (VALIUM) 5 MG tablet      diclofenac sodium (VOLTAREN) 1 % GEL      nortriptyline (PAMELOR) 25 MG capsule      pregabalin (LYRICA) 75 MG capsule      spironolactone (ALDACTONE) 50 MG tablet          DISCHARGE INSTRUCTIONS:   Patient is being transferred to [facility].  If you experience worsening of your admission symptoms, develop shortness of breath, life threatening emergency, suicidal or homicidal thoughts you must seek medical attention immediately by calling 911 or calling your MD immediately  if symptoms  less severe.  You Must read complete instructions/literature along with all the possible adverse reactions/side effects for all the Medicines you take and that have been prescribed to you. Take any new Medicines after you have completely understood and accept all the possible adverse reactions/side effects.   Please note  You were cared for by a hospitalist during your hospital stay. If you have any questions about your discharge medications or the care you received while you were in the hospital after you are discharged, you can call the unit and asked to speak with the hospitalist on call if the hospitalist that took care of you is not available. Once you are discharged, your primary care physician will handle any further medical issues. Please note that NO REFILLS for any discharge medications will be authorized once you are discharged, as it is imperative that you return to your primary care physician (or establish a relationship with a primary care physician if you do not have one) for your aftercare needs so that they can reassess your need for medications and monitor your lab values.    Today   CHIEF COMPLAINT:   Chief Complaint  Patient presents with  . Hemoptysis  . Weakness    VITAL SIGNS:  Blood pressure 97/50, pulse 85, temperature 99.6  F (37.6 C), temperature source Oral, resp. rate 28, height 5\' 7"  (1.702 m), weight 85.3 kg (188 lb 0.8 oz), SpO2 98 %.  I/O:   Intake/Output Summary (Last 24 hours) at 09/13/14 0929 Last data filed at 09/13/14 0800  Gross per 24 hour  Intake 3607.09 ml  Output    925 ml  Net 2682.09 ml    PHYSICAL EXAMINATION:   Physical Exam  GENERAL: 64 y.o.-year-old patient lying in the bed , critically ill appearing, very lethargic.  EYES: Pupils equal, round, fixed, no reaction to light. No scleral icterus. Extraocular muscles intact.  HEENT: Head atraumatic, normocephalic. Oropharynx and nasopharynx clear.  Orally intubated, dobhoff tube in place. Some bleeding in ETT noted. NECK: Supple, no jugular venous distention. No thyroid enlargement, no tenderness.  LUNGS: breathing over the vent. Normal breath sounds bilaterally, no wheezing, rales,rhonchi or crepitation. Decreased bibasilar breath sounds. No use of accessory muscles of respiration.  CARDIOVASCULAR: S1, S2 normal. No murmurs, rubs, or gallops.  ABDOMEN: Soft, nontender, distended. Hypoactive Bowel sounds present. No organomegaly or mass.  EXTREMITIES: No pedal edema, cyanosis, or clubbing.  NEUROLOGIC: lethargic, unable to follow any commands, myoclonic jerks intermittently noted. PSYCHIATRIC: The patient is intubated and sedated. SKIN: No obvious rash, lesion, or ulcer.   DATA REVIEW:   CBC  Recent Labs Lab 09/13/14 0140  WBC 19.8*  HGB 7.9*  HCT 25.6*  PLT 125*    Chemistries   Recent Labs Lab 09/12/14 1606 09/13/14 0140  NA 145 150*  K 4.1 4.4  CL 120* 119*  CO2 20* 18*  GLUCOSE 183* 141*  BUN 58* 67*  CREATININE 1.10* 1.71*  CALCIUM 8.4* 8.3*  MG  --  2.6*  AST 78*  --   ALT 53  --   ALKPHOS 98  --   BILITOT 1.5*  --     Cardiac Enzymes No results for input(s): TROPONINI in the last 168 hours.  Microbiology Results  Results for orders placed or performed during the hospital encounter  of 09/09/14  MRSA PCR Screening     Status: None   Collection Time: 09/10/14  8:19 AM  Result Value Ref Range Status   MRSA by PCR  NEGATIVE NEGATIVE Final    Comment:        The GeneXpert MRSA Assay (FDA approved for NASAL specimens only), is one component of a comprehensive MRSA colonization surveillance program. It is not intended to diagnose MRSA infection nor to guide or monitor treatment for MRSA infections.     RADIOLOGY:  Dg Abd 1 View  09/12/2014   CLINICAL DATA:  Constipation  EXAM: ABDOMEN - 1 VIEW  COMPARISON:  None.  FINDINGS: Nonobstructive bowel gas pattern.  Normal colonic stool burden.  Visualized osseous structures are within normal limits.  IMPRESSION: Unremarkable abdominal radiograph.   Electronically Signed   By: Charline Bills M.D.   On: 09/12/2014 16:57   Ct Head Wo Contrast  09/12/2014   CLINICAL DATA:  Acute onset of seizure. Altered mental status. Initial encounter.  EXAM: CT HEAD WITHOUT CONTRAST  TECHNIQUE: Contiguous axial images were obtained from the base of the skull through the vertex without intravenous contrast.  COMPARISON:  None.  FINDINGS: There is no evidence of acute infarction, mass lesion, or intra- or extra-axial hemorrhage on CT.  The posterior fossa, including the cerebellum, brainstem and fourth ventricle, is within normal limits. The third and lateral ventricles, and basal ganglia are unremarkable in appearance. The cerebral hemispheres are symmetric in appearance, with normal gray-white differentiation. No mass effect or midline shift is seen.  There is no evidence of fracture; visualized osseous structures are unremarkable in appearance. The orbits are within normal limits. The paranasal sinuses and mastoid air cells are well-aerated. No significant soft tissue abnormalities are seen.  IMPRESSION: Unremarkable noncontrast CT of the head.   Electronically Signed   By: Roanna Raider M.D.   On: 09/12/2014 23:05   US Abdomen  Complete  09/12/2014   CLINICAL DATA:  Cirrhosis  EXAM: ULTRASOUND ABDOMEN COMPLETE  COMPARISON:  None.  FINDINGS: Gallbladder: No gallstones or pericholecystic fluid is noted. Mild wall thickening is noted in parts of the gallbladder are related to the underlying ascites.  Common bile duct: Diameter: Varies in size from 6-4 mm.  Liver: Diffuse increased echogenicity is noted with some nodularity consistent with the given clinical history of cirrhosis.  IVC: No abnormality visualized.  Pancreas: Visualized portion unremarkable.  Spleen: Size and appearance within normal limits.  Right Kidney: Length: 11.3 cm. Echogenicity within normal limits. No mass or hydronephrosis visualized.  Left Kidney: Length: 9.3 cm. Echogenicity within normal limits. No mass or hydronephrosis visualized.  Abdominal aorta: No aneurysm visualized.  Other findings: Mild ascites is noted.  IMPRESSION: Changes consistent with cirrhosis of the liver with mild ascites.  No other focal abnormality is noted.   Electronically Signed   By: Alcide Clever M.D.   On: 09/12/2014 09:50   Dg Fluoro Rm 1-60 Min  09/13/2014   CLINICAL DATA:  GI bleeding. Cirrhosis. Elevated pneumonia. Need for enteric tube for medication  EXAM: FEEDING TUBE PLACEMENT IN FLUOROSCOPY  PROCEDURE: The patient was brought to Radiology. The patient was on a ventilator. Informed consent was obtained by the referring physician. Kangaroo feeding tube was placed through the left nares into the stomach without difficulty. Multiple attempts were made to enter at the duodenum but were unsuccessful. The stomach is distended with gas. The tube was left in the gastric antrum.  Complications: None  FINDINGS: Feeding tube tip in the gastric antrum  MEDICATIONS: None  IMPRESSION: Successful feeding tube placement with the tip in the gastric antrum.   Electronically Signed   By: Marlan Palau  M.D.   On: 09/13/2014 08:20   Dg Chest Port 1 View  09/12/2014   CLINICAL DATA:  Intubation   EXAM: PORTABLE CHEST - 1 VIEW  COMPARISON:  Portable exam 0958 hours compared to 09/11/2014  FINDINGS: Tip of endotracheal tube projects 3.1 cm above carina.  RIGHT arm PICC line tip projects over SVC.  Enlargement of cardiac silhouette with slight vascular congestion.  Mediastinal contours normal.  Atelectasis in the mid lungs bilaterally.  No gross infiltrate, pleural effusion or pneumothorax.  IMPRESSION: Satisfactory endotracheal tube position.  Subsegmental atelectasis in the mid lungs bilaterally.   Electronically Signed   By: Ulyses Southward M.D.   On: 09/12/2014 10:05   Dg Chest Port 1 View  09/11/2014   CLINICAL DATA:  PICC readjustment  EXAM: PORTABLE CHEST - 1 VIEW  COMPARISON:  09/11/2014 at 1:20 p.m.  FINDINGS: Shortening of the right upper extremity PICC, tip near the upper cavoatrial junction.  Stable cardiomegaly and vascular pedicle widening.  Hypoventilation with few bandlike opacities, consistent with atelectasis. No edema, effusion, or pneumothorax.  IMPRESSION: 1. Shortened right PICC, tip near the upper cavoatrial junction. 2. Cardiomegaly. 3. Hypoventilation and mild basilar atelectasis.   Electronically Signed   By: Marnee Spring M.D.   On: 09/11/2014 14:19   Dg Chest Port 1 View  09/11/2014   CLINICAL DATA:  Status post PICC line placement  EXAM: PORTABLE CHEST - 1 VIEW  COMPARISON:  09/09/2014  FINDINGS: Cardiac shadow remains enlarged. A new right-sided PICC line is noted with the catheter tip at the superior right atrium. The lungs are well aerated with mild bibasilar scarring stable from the prior exam. No bony abnormality is noted.  IMPRESSION: Status post PICC line placement in the superior right atrium   Electronically Signed   By: Alcide Clever M.D.   On: 09/11/2014 13:58    EKG:   Orders placed or performed during the hospital encounter of 09/09/14  . ED EKG  . ED EKG  . EKG 12-Lead  . EKG 12-Lead      Management plans discussed with the patient, family and they are  in agreement.  CODE STATUS:     Code Status Orders        Start     Ordered   09/10/14 0403  Full code   Continuous     09/10/14 0402      TOTAL TIME TAKING CARE OF THIS PATIENT: additional in discharge time.    Enid Baas M.D on 09/13/2014 at 9:29 AM  Between 7am to 6pm - Pager - [phone]  After 6pm go to [email or web address] - password [email or web address] [facility]  [place] [facility]  Office  [phone]  CC: Primary care physician; Leim Fabry, MD

## 2014-09-13 NOTE — Therapy (Signed)
Care transferred to air flight crew for [facility]

## 2014-09-13 NOTE — Care Management Note (Signed)
Case Management Note  Patient Details  Name: Robin Schneider MRN: [id number] Date of Birth: March 13, 1951  Subjective/Objective:  New onset seizures. Plan is transfer to [facility] for continued care.                   Action/Plan:   Expected Discharge Date:                  Expected Discharge Plan:     In-House Referral:     Discharge planning Services     Post Acute Care Choice:    Choice offered to:     DME Arranged:    DME Agency:     HH Arranged:    HH Agency:     Status of Service:     Medicare Important Message Given:    Date Medicare IM Given:    Medicare IM give by:    Date Additional Medicare IM Given:    Additional Medicare Important Message give by:     If discussed at Long Length of Stay Meetings, dates discussed:    Additional Comments:  Marily Memos, RN 09/13/2014, 10:39 AM

## 2014-09-13 NOTE — Progress Notes (Signed)
[facility] - [place] at [facility]   PATIENT NAME: Robin Schneider    MR#:  [id number]  DATE OF BIRTH:  [birth year]  SUBJECTIVE:  CHIEF COMPLAINT:   Chief Complaint  Patient presents with  . Hemoptysis  . Weakness   - Patient admitted for hematemesis, had EGD done 09/10/14 and banding of esophageal varices. -intubated and sedated in ICU for hepatic encephalopathy, dobhoff placed last evening and oral and rectal lactulose being given, no bowel movements yet and ammonia remains elevated - patient developed generalised tonic clonic seizures last night and started on propofol, ativan and phenobarb drips as she had seizures inspite of keppra. - CT head normal, pupils more fixed and dilated now.  - [facility] have been called and no beds and said that they would not do anything differently.   REVIEW OF SYSTEMS:  Review of Systems  Unable to perform ROS: mental status change    DRUG ALLERGIES:   Allergies  Allergen Reactions  . Morphine And Related Rash  . Percocet [Oxycodone-Acetaminophen] Rash    VITALS:  Blood pressure 97/50, pulse 85, temperature 99.6 F (37.6 C), temperature source Oral, resp. rate 28, height  (1.702 m), weight 85.3 kg (188 lb 0.8 oz), SpO2 98 %.  PHYSICAL EXAMINATION:  Physical Exam  GENERAL:  64 y.o.-year-old patient lying in the bed , critically ill appearing, very lethargic.  EYES: Pupils equal, round, fixed, no reaction to light. No scleral icterus. Extraocular muscles intact.  HEENT: Head atraumatic, normocephalic. Oropharynx and nasopharynx clear.  NECK:  Supple, no jugular venous distention. No thyroid enlargement, no tenderness.  LUNGS: Normal breath sounds bilaterally, no wheezing, rales,rhonchi or crepitation. Decreased bibasilar breath sounds. No use of accessory muscles of respiration.  CARDIOVASCULAR: S1, S2 normal. No murmurs, rubs, or gallops.  ABDOMEN: Soft, nontender, distended. Hypoactive Bowel sounds  present. No organomegaly or mass.  EXTREMITIES: No pedal edema, cyanosis, or clubbing.  NEUROLOGIC: lethargic, unable to follow any commands, myoclonic jerks intermittently noted. PSYCHIATRIC: The patient is intubated and sedated. SKIN: No obvious rash, lesion, or ulcer.    LABORATORY PANEL:   CBC  Recent Labs Lab 09/13/14 0140  WBC 19.8*  HGB 7.9*  HCT 25.6*  PLT 125*   ------------------------------------------------------------------------------------------------------------------  Chemistries   Recent Labs Lab 09/12/14 1606 09/13/14 0140  NA 145 150*  K 4.1 4.4  CL 120* 119*  CO2 20* 18*  GLUCOSE 183* 141*  BUN 58* 67*  CREATININE 1.10* 1.71*  CALCIUM 8.4* 8.3*  MG  --  2.6*  AST 78*  --   ALT 53  --   ALKPHOS 98  --   BILITOT 1.5*  --    ------------------------------------------------------------------------------------------------------------------  Cardiac Enzymes No results for input(s): TROPONINI in the last 168 hours. ------------------------------------------------------------------------------------------------------------------  RADIOLOGY:  Dg Abd 1 View  09/12/2014   CLINICAL DATA:  Constipation  EXAM: ABDOMEN - 1 VIEW  COMPARISON:  None.  FINDINGS: Nonobstructive bowel gas pattern.  Normal colonic stool burden.  Visualized osseous structures are within normal limits.  IMPRESSION: Unremarkable abdominal radiograph.   Electronically Signed   By: Charline Bills M.D.   On: 09/12/2014 16:57   Ct Head Wo Contrast  09/12/2014   CLINICAL DATA:  Acute onset of seizure. Altered mental status. Initial encounter.  EXAM: CT HEAD WITHOUT CONTRAST  TECHNIQUE: Contiguous axial images were obtained from the base of the skull through the vertex without intravenous contrast.  COMPARISON:  None.  FINDINGS: There is no evidence of  acute infarction, mass lesion, or intra- or extra-axial hemorrhage on CT.  The posterior fossa, including the cerebellum, brainstem and  fourth ventricle, is within normal limits. The third and lateral ventricles, and basal ganglia are unremarkable in appearance. The cerebral hemispheres are symmetric in appearance, with normal gray-white differentiation. No mass effect or midline shift is seen.  There is no evidence of fracture; visualized osseous structures are unremarkable in appearance. The orbits are within normal limits. The paranasal sinuses and mastoid air cells are well-aerated. No significant soft tissue abnormalities are seen.  IMPRESSION: Unremarkable noncontrast CT of the head.   Electronically Signed   By: Roanna Raider M.D.   On: 09/12/2014 23:05   US Abdomen Complete  09/12/2014   CLINICAL DATA:  Cirrhosis  EXAM: ULTRASOUND ABDOMEN COMPLETE  COMPARISON:  None.  FINDINGS: Gallbladder: No gallstones or pericholecystic fluid is noted. Mild wall thickening is noted in parts of the gallbladder are related to the underlying ascites.  Common bile duct: Diameter: Varies in size from 6-4 mm.  Liver: Diffuse increased echogenicity is noted with some nodularity consistent with the given clinical history of cirrhosis.  IVC: No abnormality visualized.  Pancreas: Visualized portion unremarkable.  Spleen: Size and appearance within normal limits.  Right Kidney: Length: 11.3 cm. Echogenicity within normal limits. No mass or hydronephrosis visualized.  Left Kidney: Length: 9.3 cm. Echogenicity within normal limits. No mass or hydronephrosis visualized.  Abdominal aorta: No aneurysm visualized.  Other findings: Mild ascites is noted.  IMPRESSION: Changes consistent with cirrhosis of the liver with mild ascites.  No other focal abnormality is noted.   Electronically Signed   By: Alcide Clever M.D.   On: 09/12/2014 09:50   Dg Fluoro Rm 1-60 Min  09/13/2014   CLINICAL DATA:  GI bleeding. Cirrhosis. Elevated pneumonia. Need for enteric tube for medication  EXAM: FEEDING TUBE PLACEMENT IN FLUOROSCOPY  PROCEDURE: The patient was brought to Radiology.  The patient was on a ventilator. Informed consent was obtained by the referring physician. Kangaroo feeding tube was placed through the left nares into the stomach without difficulty. Multiple attempts were made to enter at the duodenum but were unsuccessful. The stomach is distended with gas. The tube was left in the gastric antrum.  Complications: None  FINDINGS: Feeding tube tip in the gastric antrum  MEDICATIONS: None  IMPRESSION: Successful feeding tube placement with the tip in the gastric antrum.   Electronically Signed   By: Marlan Palau M.D.   On: 09/13/2014 08:20   Dg Chest Port 1 View  09/12/2014   CLINICAL DATA:  Intubation  EXAM: PORTABLE CHEST - 1 VIEW  COMPARISON:  Portable exam 0958 hours compared to 09/11/2014  FINDINGS: Tip of endotracheal tube projects 3.1 cm above carina.  RIGHT arm PICC line tip projects over SVC.  Enlargement of cardiac silhouette with slight vascular congestion.  Mediastinal contours normal.  Atelectasis in the mid lungs bilaterally.  No gross infiltrate, pleural effusion or pneumothorax.  IMPRESSION: Satisfactory endotracheal tube position.  Subsegmental atelectasis in the mid lungs bilaterally.   Electronically Signed   By: Ulyses Southward M.D.   On: 09/12/2014 10:05   Dg Chest Port 1 View  09/11/2014   CLINICAL DATA:  PICC readjustment  EXAM: PORTABLE CHEST - 1 VIEW  COMPARISON:  09/11/2014 at 1:20 p.m.  FINDINGS: Shortening of the right upper extremity PICC, tip near the upper cavoatrial junction.  Stable cardiomegaly and vascular pedicle widening.  Hypoventilation with few bandlike opacities, consistent with  atelectasis. No edema, effusion, or pneumothorax.  IMPRESSION: 1. Shortened right PICC, tip near the upper cavoatrial junction. 2. Cardiomegaly. 3. Hypoventilation and mild basilar atelectasis.   Electronically Signed   By: Marnee Spring M.D.   On: 09/11/2014 14:19   Dg Chest Port 1 View  09/11/2014   CLINICAL DATA:  Status post PICC line placement  EXAM:  PORTABLE CHEST - 1 VIEW  COMPARISON:  09/09/2014  FINDINGS: Cardiac shadow remains enlarged. A new right-sided PICC line is noted with the catheter tip at the superior right atrium. The lungs are well aerated with mild bibasilar scarring stable from the prior exam. No bony abnormality is noted.  IMPRESSION: Status post PICC line placement in the superior right atrium   Electronically Signed   By: Alcide Clever M.D.   On: 09/11/2014 13:58    EKG:   Orders placed or performed during the hospital encounter of 09/09/14  . ED EKG  . ED EKG  . EKG 12-Lead  . EKG 12-Lead    ASSESSMENT AND PLAN:   64y/o AAF with PMH of Hep C, Afib on asa, HTN, PAD admitted for Hematemesis.  * GI bleed- variceal upper GI bleed.  - Hemoglobin now at 7.9, - EGD with esophageal varices and bands have been placed. on octreotide drip - also has dobhoff tube in place -Continue IV Protonix twice a day at this time. - will transfuse with 1unit today if ok by GI - Empirically started on rocephin  *. Hepatic encephalopathy- ammonia very elevated, >570 - on rectal lactulose and oral lactulose through dobhoff tube - on rifaximin as well, no significant improvement- actually worsening. - no bowel movements so far - GI on board - monitor closely.  * Acute respiratory Failure- intubated for airway protection - appreciate Dr. Clovis Fredrickson input  * Generalized tonic clonic seizures- secondary to hyperammonemia - CT head normal, no loss of gray-white mater differentiation - EEG today, neurologist notified - cont phenobarb and ativan drips- - if continues to have myoclonic jerks- give  ativan bolus and continue drip - If no improvement- will load with 400 mg IV Vimpat and continue 200 mg IV BID vimpat  * Liver cirrhosis- pt and family not aware of diagnosis - remote history of alc use and also has hepatitis C - had outpt MRI of liver and EGD done few months ago for the same - US liver with again cirrhosis.  * Shock-  with low BP, IV fluids - also on levophed  * ARF- ATN from hypotension and also could be hepatorenal syndrome - [place] abd yesterday with normal kidneys. Foley placed and reasonable urine output for now.  * Afib and junctional rhythm- known chronic history, hold asa with GI bleed - Rate is controlled, not on any meds  * DVT prophylaxis- cont SQ heparin for now   All the records are reviewed and case discussed with Care Management/Social Workerr. Management plans discussed with the patient, family and they are in agreement.  CODE STATUS: FULL CODE  TOTAL CRITICAL CARE TIME SPENT IN TAKING CARE OF THIS PATIENT: 90 minutes.   CRITICALLY ILL PATIENT AND VERY HIGH RISK FOR CARDIORESPIRATORY ARREST- FAMILY NOTIFIED OF THE SAME.  Enid Baas M.D on 09/13/2014 at 8:25 AM  Between 7am to 6pm - Pager - [phone]  After 6pm go to [email or web address] - password [email or web address] [facility]  [place] [facility]  Office  [phone]  CC: Primary care physician; Leim Fabry, MD

## 2014-09-13 NOTE — Progress Notes (Signed)
   09/12/14 2345  Clinical Encounter Type  Visited With Patient and family together  Visit Type Follow-up;Spiritual support;Critical Care  Referral From Nurse  Consult/Referral To Chaplain  Spiritual Encounters  Spiritual Needs Prayer;Emotional;Grief support  Stress Factors  Family Stress Factors Exhausted;Family relationships;Health changes;Loss of control;Major life changes  Paged to patient's room. Family members were very emotional. Reassured family that medical staff was engaged and researching to attempt to discover issues with patient's condition. Family began to calm. Physician explained actions taken and answered questions.   Chap. Akeylah Hendel G. Zuleyma Scharf, ext. [phone]

## 2014-09-13 NOTE — Progress Notes (Signed)
Pt remains on vent. Vent settings as charted.Pt had tonic clonic seizure lasts for 7-70mts. inj ativan and keppra given. Second time had seizure activity starts ativan drip and pentobarbital drip. Sedated with propofol..  Levophed started for low BP. Foley in place. UOp only . Ct head done.no seizure activity noted now..resting comfortably.

## 2014-09-13 NOTE — Progress Notes (Addendum)
Given refractory seizures - questionable etiology  Check INR, repeat CT Head - possible bleed was too early to detect on prior film Metabolic derangements - hepatic encephalopathy is possible to cause seizure activity particularly at her ammonia level, she has been receiving lactulose (without resultant bowel movement)will also add Xifaxan, follow ammonia trend

## 2014-09-13 NOTE — Progress Notes (Addendum)
Spoke to Dr. Nemiah Commander, Dr. Katrinka Blazing and Dr. Belia Heman this morning- Pt having seizures since last night- EEG performed this am- ammonia level 570- hgb 7.9-fixed pupils no bowel movement- until around 930- starting to have constant loose dark tarry stools- sample sent to lab for occult testing.  Unable to keep flexiseal in- tried twice. Pt to transfer to [facility]- [facility] here at this time waiting for another ABG.  Pt on of levophed- was on ativan drip-  Now on versed drip.  Sedated with propofol- phenobaritol drip and sandostatin drip infusing- with Multivit bag and d5 with .2NS-gave report to Janette with [facility] nurse who is here at this time.

## 2014-09-13 NOTE — Consult Note (Signed)
PULMONARY / CRITICAL CARE MEDICINE   Name: Robin Schneider MRN: [id number] DOB: 1950/12/03    ADMISSION DATE:  09/09/2014    CHIEF COMPLAINT:   Acute resp failure, mental status changes   HISTORY OF PRESENT ILLNESS   64 yo AAF admitted to ICU for acute mental status and intubated for acute resp failure Patient s/p EGD for banding varices  Patient with hemetemisis, patient with h/o Hep c and ETOh abuse multiple medical issues Patient remain critically ill, increased WOB, using accessory muscles to breathe, had seizures last night placed on phenobarb infusion, started on propofol.   Placed on vasopressors. To be transferred to [facility] for further care and management    SIGNIFICANT EVENTS  Intubated 6/21    PAST MEDICAL HISTORY    :  Past Medical History  Diagnosis Date  . Hypertension   . A-fib   . PAD (peripheral artery disease)    Past Surgical History  Procedure Laterality Date  . Replacement total knee bilateral    . Cardiac electrophysiology study and ablation      Alblation  . Abdominal hysterectomy     Prior to Admission medications   Medication Sig Start Date End Date Taking? Authorizing Provider  albuterol (ACCUNEB) 1.25 MG/3ML nebulizer solution Inhale 3 mLs into the lungs every 6 (six) hours as needed for wheezing or shortness of breath.    Yes Historical Provider, MD  albuterol (VENTOLIN HFA) 108 (90 BASE) MCG/ACT inhaler Inhale 2 puffs into the lungs every 4 (four) hours as needed for wheezing or shortness of breath.  02/04/10  Yes Historical Provider, MD  aspirin EC 81 MG tablet Take 1 tablet by mouth daily.    Yes Historical Provider, MD  diazepam (VALIUM) 5 MG tablet Take 10 mg by mouth every 12 (twelve) hours as needed for anxiety.  10/12/12  Yes Historical Provider, MD  diclofenac sodium (VOLTAREN) 1 % GEL Apply 2 g topically 2 (two) times daily. 07/25/14 07/25/15 Yes Historical Provider, MD  nortriptyline (PAMELOR) 25 MG capsule Take 25 mg by  mouth at bedtime.   Yes Historical Provider, MD  olopatadine (PATANOL) 0.1 % ophthalmic solution Apply 1 drop to eye 2 (two) times daily. 06/19/14  Yes Historical Provider, MD  pregabalin (LYRICA) 75 MG capsule Take 1 capsule by mouth 2 (two) times daily. 07/12/14 07/12/15 Yes Historical Provider, MD  spironolactone (ALDACTONE) 50 MG tablet Take 1 tablet by mouth daily at 12 noon. 08/19/13  Yes Historical Provider, MD   Allergies  Allergen Reactions  . Morphine And Related Rash  . Percocet [Oxycodone-Acetaminophen] Rash     FAMILY HISTORY   Family History  Problem Relation Age of Onset  . Cancer      various types; throughout the family  . Hypertension      multiple family members      SOCIAL HISTORY    reports that she has been smoking Cigarettes.  She has a 24 pack-year smoking history. She does not have any smokeless tobacco history on file. She reports that she does not drink alcohol or use illicit drugs.  Review of Systems  Unable to perform ROS: critical illness      VITAL SIGNS    Temp:  [97.6 F (36.4 C)-99.8 F (37.7 C)] 99.6 F (37.6 C) (06/22 0800) Pulse Rate:  [44-162] 85 (06/22 0800) Resp:  [14-42] 28 (06/22 0800) BP: (82-177)/(43-88) 97/50 mmHg (06/22 0800) SpO2:  [78 %-100 %] 98 % (06/22 0800) FiO2 (%):  [40 %] 40 % (  06/22 0824) Weight:  [188 lb 0.8 oz (85.3 kg)] 188 lb 0.8 oz (85.3 kg) (06/22 0611) HEMODYNAMICS:   VENTILATOR SETTINGS: Vent Mode:  [-] PRVC FiO2 (%):  [40 %] 40 % Set Rate:  [18 bmp] 18 bmp Vt Set:  [450 mL] 450 mL PEEP:  [5 cmH20] 5 cmH20 Plateau Pressure:  [16 cmH20] 16 cmH20 INTAKE / OUTPUT:  Intake/Output Summary (Last 24 hours) at 09/13/14 0926 Last data filed at 09/13/14 0800  Gross per 24 hour  Intake 3607.09 ml  Output    925 ml  Net 2682.09 ml       PHYSICAL EXAM   Physical Exam  Constitutional: She appears distressed.  HENT:  Head: Normocephalic and atraumatic.  Eyes: Pupils are equal, round, and reactive  to light. No scleral icterus.  Neck: Normal range of motion. Neck supple.  Cardiovascular: Normal rate and regular rhythm.   No murmur heard. Pulmonary/Chest: She is in respiratory distress. She has no wheezes. She has rales.  resp distress  Abdominal: Soft. She exhibits no distension. There is no tenderness.  Musculoskeletal: She exhibits no edema.  Neurological: She displays normal reflexes. Coordination normal.  gcs<8T  Skin: Skin is warm. No rash noted. She is diaphoretic.       LABS   LABS:  CBC  Recent Labs Lab 09/12/14 0523 09/12/14 1606 09/13/14 0140  WBC 12.1* 14.8* 19.8*  HGB 8.1* 8.7* 7.9*  HCT 26.1* 27.9* 25.6*  PLT 103* 116* 125*   Coag's  Recent Labs Lab 09/09/14 2210 09/12/14 1605 09/12/14 1606 09/13/14 0605  APTT  --   --  27  --   INR 1.64 1.67  --  2.10   BMET  Recent Labs Lab 09/12/14 0523 09/12/14 1606 09/13/14 0140  NA 147* 145 150*  K 4.6 4.1 4.4  CL 117* 120* 119*  CO2 21* 20* 18*  BUN 50* 58* 67*  CREATININE 1.07* 1.10* 1.71*  GLUCOSE 155* 183* 141*   Electrolytes  Recent Labs Lab 09/12/14 0523 09/12/14 1606 09/13/14 0140  CALCIUM 8.8* 8.4* 8.3*  MG  --   --  2.6*  PHOS  --   --  5.3*   Sepsis Markers  Recent Labs Lab 09/10/14 0052 09/10/14 0411  LATICACIDVEN 1.6 1.9   ABG  Recent Labs Lab 09/12/14 0459 09/12/14 1135  PHART 7.45 7.42  PCO2ART 29* 31*  PO2ART 147* 92   Liver Enzymes  Recent Labs Lab 09/09/14 2210 09/12/14 1606  AST 40 78*  ALT 16 53  ALKPHOS 105 98  BILITOT 1.1 1.5*  ALBUMIN 3.0* 3.3*   Cardiac Enzymes No results for input(s): TROPONINI, PROBNP in the last 168 hours. Glucose  Recent Labs Lab 09/11/14 0855 09/12/14 1136 09/12/14 1636 09/12/14 2019 09/13/14 0513 09/13/14 0808  GLUCAP 95 144* 175* 205* 120* 160*     Recent Results (from the past 240 hour(s))  MRSA PCR Screening     Status: None   Collection Time: 09/10/14  8:19 AM  Result Value Ref Range Status    MRSA by PCR NEGATIVE NEGATIVE Final    Comment:        The GeneXpert MRSA Assay (FDA approved for NASAL specimens only), is one component of a comprehensive MRSA colonization surveillance program. It is not intended to diagnose MRSA infection nor to guide or monitor treatment for MRSA infections.      Current facility-administered medications:  .  albuterol (PROVENTIL) (2.5 MG/3ML) 0.083% nebulizer solution 3 mL, 3 mL, Inhalation, Q4H  PRN, Arnaldo Natal, MD .  cefTRIAXone (ROCEPHIN) 1 g in dextrose 5 % 50 mL IVPB - Premix, 1 g, Intravenous, Q24H, Bertram Savin, RPH, 1 g at 09/12/14 1353 .  dextrose 5 % and 0.2 % NaCl infusion, , Intravenous, Continuous, Erin Fulling, MD, Last Rate: 75 mL/hr at 09/12/14 1516 .  fentaNYL (SUBLIMAZE) injection 100 mcg, 100 mcg, Intravenous, Q1H PRN, Wyatt Haste, MD, 100 mcg at 09/13/14 0900 .  fentaNYL in NS (110mcg/ml) infusion-PREMIX, 10 mcg/hr, Intravenous, Continuous, Bertram Savin, RPH, Last Rate: 15 mL/hr at 09/12/14 1547, 150 mcg/hr at 09/12/14 1547 .  insulin aspart (novoLOG) injection 0-15 Units, 0-15 Units, Subcutaneous, 6 times per day, Erin Fulling, MD, 3 Units at 09/13/14 [phone] .  lactulose (CHRONULAC) 10 GM/15ML solution 30 g, 30 g, Oral, Q2H, Wallace Cullens, MD .  lactulose [facility]) enema 200 gm, 300 mL, Rectal, Q4H, Enid Baas, MD, 300 mL at 09/12/14 1516 .  LORazepam (ATIVAN) 50 mg in dextrose 5 % 50 mL (1 mg/mL) infusion, 1-10 mg/hr, Intravenous, Titrated, Wyatt Haste, MD, Last Rate: 8 mL/hr at 09/13/14 0800, 8 mg/hr at 09/13/14 0800 .  midazolam (VERSED) injection 2-4 mg, 2-4 mg, Intravenous, Q1H PRN, Bertram Savin, RPH, 4 mg at 09/13/14 0900 .  multivitamins adult (MVI -12) 10 mL in dextrose 5% lactated ringers 1,000 mL infusion, , Intravenous, Q24H, Erin Fulling, MD .  norepinephrine (LEVOPHED)  in D5W premix infusion, 0-40 mcg/min, Intravenous, Titrated, Enid Baas, MD, Last Rate: 37.5  mL/hr at 09/13/14 0800, 10 mcg/min at 09/13/14 0800 .  octreotide (SANDOSTATIN) 500 mcg in sodium chloride 0.9 % 250 mL (2 mcg/mL) infusion, 25 mcg/hr, Intravenous, Continuous, Enid Baas, MD, Last Rate: 12.5 mL/hr at 09/13/14 0800, 25 mcg/hr at 09/13/14 0800 .  olopatadine (PATANOL) 0.1 % ophthalmic solution 1 drop, 1 drop, Both Eyes, BID, Arnaldo Natal, MD, 1 drop at 09/12/14 2133 .  [DISCONTINUED] ondansetron (ZOFRAN) tablet 4 mg, 4 mg, Oral, Q6H PRN **OR** ondansetron (ZOFRAN) injection 4 mg, 4 mg, Intravenous, Q6H PRN, Arnaldo Natal, MD, 4 mg at 09/10/14 1052 .  pantoprazole (PROTONIX) injection 40 mg, 40 mg, Intravenous, Q12H, Enid Baas, MD, 40 mg at 09/12/14 2131 .  [COMPLETED] PENTobarbital (NEMBUTAL) 8.33 mg/mL load via infusion 413.5 mg, 5 mg/kg, Intravenous, Once, 413.5 mg at 09/13/14 0256 **AND** PENTobarbital (NEMBUTAL) 2,500 mg in dextrose 5 % 300 mL (8.3333 mg/mL) infusion, 1-5 mg/kg/hr, Intravenous, Continuous, Wyatt Haste, MD, Last Rate: 9.9 mL/hr at 09/13/14 0800, 1 mg/kg/hr at 09/13/14 0800 .  propofol (DIPRIVAN) 1000 MG/100ML infusion, 5-80 mcg/kg/min, Intravenous, Titrated, Wyatt Haste, MD, Last Rate: 37.2 mL/hr at 09/13/14 0802, 75 mcg/kg/min at 09/13/14 0802 .  rifaximin (XIFAXAN) tablet 550 mg, 550 mg, Oral, BID, Wyatt Haste, MD, 550 mg at 09/13/14 0411 .  sodium chloride 0.9 % injection 10 mL, 10 mL, Intravenous, Q12H, Enid Baas, MD, 10 mL at 09/13/14 0004 .  sodium chloride 0.9 % injection 3 mL, 3 mL, Intravenous, Q12H, Arnaldo Natal, MD, 3 mL at 09/12/14 2132 .  sterile water (preservative free) injection, , , ,  .  sterile water (preservative free) injection, , , ,  .  vecuronium (NORCURON) 10 MG injection, , , ,  .  vecuronium (NORCURON) 10 MG injection, , , ,   IMAGING    Dg Abd 1 View  09/12/2014   CLINICAL DATA:  Constipation  EXAM: ABDOMEN - 1 VIEW  COMPARISON:  None.  FINDINGS: Nonobstructive bowel gas pattern.  Normal  colonic stool burden.  Visualized osseous structures are within normal limits.  IMPRESSION: Unremarkable abdominal radiograph.   Electronically Signed   By: Charline Bills M.D.   On: 09/12/2014 16:57   Ct Head Wo Contrast  09/12/2014   CLINICAL DATA:  Acute onset of seizure. Altered mental status. Initial encounter.  EXAM: CT HEAD WITHOUT CONTRAST  TECHNIQUE: Contiguous axial images were obtained from the base of the skull through the vertex without intravenous contrast.  COMPARISON:  None.  FINDINGS: There is no evidence of acute infarction, mass lesion, or intra- or extra-axial hemorrhage on CT.  The posterior fossa, including the cerebellum, brainstem and fourth ventricle, is within normal limits. The third and lateral ventricles, and basal ganglia are unremarkable in appearance. The cerebral hemispheres are symmetric in appearance, with normal gray-white differentiation. No mass effect or midline shift is seen.  There is no evidence of fracture; visualized osseous structures are unremarkable in appearance. The orbits are within normal limits. The paranasal sinuses and mastoid air cells are well-aerated. No significant soft tissue abnormalities are seen.  IMPRESSION: Unremarkable noncontrast CT of the head.   Electronically Signed   By: Roanna Raider M.D.   On: 09/12/2014 23:05   US Abdomen Complete  09/12/2014   CLINICAL DATA:  Cirrhosis  EXAM: ULTRASOUND ABDOMEN COMPLETE  COMPARISON:  None.  FINDINGS: Gallbladder: No gallstones or pericholecystic fluid is noted. Mild wall thickening is noted in parts of the gallbladder are related to the underlying ascites.  Common bile duct: Diameter: Varies in size from 6-4 mm.  Liver: Diffuse increased echogenicity is noted with some nodularity consistent with the given clinical history of cirrhosis.  IVC: No abnormality visualized.  Pancreas: Visualized portion unremarkable.  Spleen: Size and appearance within normal limits.  Right Kidney: Length: 11.3 cm.  Echogenicity within normal limits. No mass or hydronephrosis visualized.  Left Kidney: Length: 9.3 cm. Echogenicity within normal limits. No mass or hydronephrosis visualized.  Abdominal aorta: No aneurysm visualized.  Other findings: Mild ascites is noted.  IMPRESSION: Changes consistent with cirrhosis of the liver with mild ascites.  No other focal abnormality is noted.   Electronically Signed   By: Alcide Clever M.D.   On: 09/12/2014 09:50   Dg Fluoro Rm 1-60 Min  09/13/2014   CLINICAL DATA:  GI bleeding. Cirrhosis. Elevated pneumonia. Need for enteric tube for medication  EXAM: FEEDING TUBE PLACEMENT IN FLUOROSCOPY  PROCEDURE: The patient was brought to Radiology. The patient was on a ventilator. Informed consent was obtained by the referring physician. Kangaroo feeding tube was placed through the left nares into the stomach without difficulty. Multiple attempts were made to enter at the duodenum but were unsuccessful. The stomach is distended with gas. The tube was left in the gastric antrum.  Complications: None  FINDINGS: Feeding tube tip in the gastric antrum  MEDICATIONS: None  IMPRESSION: Successful feeding tube placement with the tip in the gastric antrum.   Electronically Signed   By: Marlan Palau M.D.   On: 09/13/2014 08:20   Dg Chest Port 1 View  09/12/2014   CLINICAL DATA:  Intubation  EXAM: PORTABLE CHEST - 1 VIEW  COMPARISON:  Portable exam 0958 hours compared to 09/11/2014  FINDINGS: Tip of endotracheal tube projects 3.1 cm above carina.  RIGHT arm PICC line tip projects over SVC.  Enlargement of cardiac silhouette with slight vascular congestion.  Mediastinal contours normal.  Atelectasis in the mid lungs bilaterally.  No gross infiltrate, pleural effusion or pneumothorax.  IMPRESSION: Satisfactory endotracheal tube position.  Subsegmental atelectasis in the mid lungs bilaterally.   Electronically Signed   By: Ulyses Southward M.D.   On: 09/12/2014 10:05      Indwelling Urinary Catheter  continued, requirement due to   Reason to continue Indwelling Urinary Catheter for strict Intake/Output monitoring for hemodynamic instability   Central/PICC line continued, requirement due to   Reason to continue Central Line Monitoring of central venous pressure or other hemodynamic parameters   Ventilator continued, requirement due to, resp failure    Ventilator Sedation RASS 0 to -1      ASSESSMENT/PLAN   64 yo AAF admitted to ICU for Acute resp failure from acute hepatic encephalopathy with inability to protect airway in the setting of GIB and hep C, complicated by acute seizures   PULMONARY -Respiratory Failure-follow up ABg and CXR as needed -continue Full MV support -continue Bronchodilator Therapy -Wean Fio2 and PEEP as tolerated   CARDIOVASCULAR Continue ICU montioring adn BP monitoring -Vasopressors if needed keep MAP>65  RENAL -follow chem 7 -follow UO -continue Foley Catheter-assess need   GASTROINTESTINAL -GIB from varices -on octreotide and PPI -follow GI recs -dubhoff placed-given lactulose-ammonia still elavated  HEMATOLOGIC -follow h/h-no transfusion needed at this time  INFECTIOUS -emperic abx therapy for varcieal bleed with rocephin  ENDOCRINE - ICU hypoglycemic\Hyperglycemia protocol   NEUROLOGIC-acute seizures -keppra and phenobarb infusion-will need continious eeg monitoring. - intubated and sedated - minimal sedation to achieve a RASS goal: -1   Unable to wean from vent at this time, patient with multiorgan failure   I have personally obtained a history, examined the patient, evaluated laboratory and imaging results, formulated the assessment and plan and placed orders.  The Patient requires high complexity decision making for assessment and support, frequent evaluation and titration of therapies, application of advanced monitoring technologies and extensive interpretation of multiple databases. Critical Care Time devoted to  patient care services described in this note is 45  minutes.   Overall, patient is critically ill, prognosis is guarded. Patient at high risk for cardiac arrest and death.   Lucie Leather, M.D. Pulmonary & Critical Care Medicine [facility] Medical Director Intensive Care Unit   09/13/2014, 9:26 AM

## 2014-09-13 NOTE — Consult Note (Signed)
Reason for Consult:seizure Referring Physician: Dr. Woodlan  Robin Schneider is an 64 y.o. female.  HPI: seen at request of Dr. Woodlan for seizures;  Pt who originally presented with N/V and was noted to not have a bowel movement for a week.  While in hospital, it was noted that she had a GI bleed.  Pt then was noted to have a seizure last night and was given ativan.  Pt then continued to have seizures and was placed on Ativan gtt and Keppra.  This morning, pt still had some seizure like activity so was given 10mg Ativan to stop seizure activity.  Pt was awaiting transfer to [facility].  Past Medical History  Diagnosis Date  . Hypertension   . A-fib   . PAD (peripheral artery disease)     Past Surgical History  Procedure Laterality Date  . Replacement total knee bilateral    . Cardiac electrophysiology study and ablation      Alblation  . Abdominal hysterectomy      Family History  Problem Relation Age of Onset  . Cancer      various types; throughout the family  . Hypertension      multiple family members    Social History:  reports that she has been smoking Cigarettes.  She has a 24 pack-year smoking history. She does not have any smokeless tobacco history on file. She reports that she does not drink alcohol or use illicit drugs.  Allergies:  Allergies  Allergen Reactions  . Morphine And Related Rash  . Percocet [Oxycodone-Acetaminophen] Rash    Medications: reviewed by me as per chart  Results for orders placed or performed during the hospital encounter of 09/09/14 (from the past 48 hour(s))  Potassium     Status: None   Collection Time: 09/11/14  2:46 PM  Result Value Ref Range   Potassium 4.9 3.5 - 5.1 mmol/L  Ammonia     Status: Abnormal   Collection Time: 09/11/14  2:50 PM  Result Value Ref Range   Ammonia 164 (H) 9 - 35 umol/L  Urinalysis complete, with microscopic ([facility] only)     Status: Abnormal   Collection Time: 09/11/14  5:25 PM  Result Value Ref  Range   Color, Urine YELLOW (A) YELLOW   APPearance CLEAR (A) CLEAR   Glucose, UA NEGATIVE NEGATIVE mg/dL   Bilirubin Urine NEGATIVE NEGATIVE   Ketones, ur NEGATIVE NEGATIVE mg/dL   Specific Gravity, Urine 1.015 1.005 - 1.030   Hgb urine dipstick NEGATIVE NEGATIVE   pH 5.0 5.0 - 8.0   Protein, ur NEGATIVE NEGATIVE mg/dL   Nitrite NEGATIVE NEGATIVE   Leukocytes, UA NEGATIVE NEGATIVE   RBC / HPF 6-30 0 - 5 RBC/hpf   WBC, UA 6-30 0 - 5 WBC/hpf   Bacteria, UA NONE SEEN NONE SEEN   Squamous Epithelial / LPF 0-5 (A) NONE SEEN  Blood gas, arterial     Status: Abnormal (Preliminary result)   Collection Time: 09/12/14  4:59 AM  Result Value Ref Range   FIO2 0.28 %   Delivery systems PENDING    Inspiratory PAP PENDING    Expiratory PAP PENDING    pH, Arterial 7.45 7.350 - 7.450   pCO2 arterial 29 (L) 32.0 - 48.0 mmHg   pO2, Arterial 147 (H) 83.0 - 108.0 mmHg   Bicarbonate 20.2 (L) 21.0 - 28.0 mEq/L   Acid-base deficit 2.6 (H) 0.0 - 2.0 mmol/L   O2 Saturation 99.4 %   Patient temperature 37.0      Oxygen index PENDING    Collection site RIGHT RADIAL    Sample type ARTERIAL DRAW    Allens test (pass/fail) PENDING PASS  CBC     Status: Abnormal   Collection Time: 09/12/14  5:23 AM  Result Value Ref Range   WBC 12.1 (H) 3.6 - 11.0 K/uL   RBC 3.38 (L) 3.80 - 5.20 MIL/uL   Hemoglobin 8.1 (L) 12.0 - 16.0 g/dL   HCT 26.1 (L) 35.0 - 47.0 %   MCV 77.2 (L) 80.0 - 100.0 fL   MCH 23.9 (L) 26.0 - 34.0 pg   MCHC 31.0 (L) 32.0 - 36.0 g/dL   RDW 17.8 (H) 11.5 - 14.5 %   Platelets 103 (L) 150 - 440 K/uL  Basic metabolic panel     Status: Abnormal   Collection Time: 09/12/14  5:23 AM  Result Value Ref Range   Sodium 147 (H) 135 - 145 mmol/L   Potassium 4.6 3.5 - 5.1 mmol/L   Chloride 117 (H) 101 - 111 mmol/L   CO2 21 (L) 22 - 32 mmol/L   Glucose, Bld 155 (H) 65 - 99 mg/dL   BUN 50 (H) 6 - 20 mg/dL   Creatinine, Ser 1.07 (H) 0.44 - 1.00 mg/dL   Calcium 8.8 (L) 8.9 - 10.3 mg/dL   GFR calc non  Af Amer 54 (L) >60 mL/min   GFR calc Af Amer >60 >60 mL/min    Comment: (NOTE) The eGFR has been calculated using the CKD EPI equation. This calculation has not been validated in all clinical situations. eGFR's persistently <60 mL/min signify possible Chronic Kidney Disease.    Anion gap 9 5 - 15  Ammonia     Status: Abnormal   Collection Time: 09/12/14  5:23 AM  Result Value Ref Range   Ammonia 364 (H) 9 - 35 umol/L  Blood gas, arterial     Status: Abnormal (Preliminary result)   Collection Time: 09/12/14 11:35 AM  Result Value Ref Range   FIO2 40.00 %   O2 Content PENDING L/min   Mode PRESSURE REGULATED VOLUME CONTROL    VT 450 mL   Rate 18 resp/min   pH, Arterial 7.42 7.350 - 7.450   pCO2 arterial 31 (L) 32.0 - 48.0 mmHg   pO2, Arterial 92 83.0 - 108.0 mmHg   Bicarbonate 20.1 (L) 21.0 - 28.0 mEq/L   TCO2 PENDING 0 - 100 mmol/L   Acid-base deficit 3.4 (H) 0.0 - 2.0 mmol/L   O2 Saturation 97.3 %   Patient temperature 37.0    Collection site LEFT RADIAL    Sample type ARTERIAL DRAW    Allens test (pass/fail) POSITIVE (A) PASS   Mechanical Rate PENDING   Glucose, capillary     Status: Abnormal   Collection Time: 09/12/14 11:36 AM  Result Value Ref Range   Glucose-Capillary 144 (H) 65 - 99 mg/dL  Protime-INR     Status: Abnormal   Collection Time: 09/12/14  4:05 PM  Result Value Ref Range   Prothrombin Time 19.9 (H) 11.4 - 15.0 seconds   INR 1.67   CBC     Status: Abnormal   Collection Time: 09/12/14  4:06 PM  Result Value Ref Range   WBC 14.8 (H) 3.6 - 11.0 K/uL   RBC 3.63 (L) 3.80 - 5.20 MIL/uL   Hemoglobin 8.7 (L) 12.0 - 16.0 g/dL   HCT 27.9 (L) 35.0 - 47.0 %   MCV 76.7 (L) 80.0 - 100.0 fL   MCH 23.9 (  L) 26.0 - 34.0 pg   MCHC 31.2 (L) 32.0 - 36.0 g/dL   RDW 17.7 (H) 11.5 - 14.5 %   Platelets 116 (L) 150 - 440 K/uL  Ammonia     Status: Abnormal   Collection Time: 09/12/14  4:06 PM  Result Value Ref Range   Ammonia 491 (H) 9 - 35 umol/L  APTT     Status: None    Collection Time: 09/12/14  4:06 PM  Result Value Ref Range   aPTT 27 24 - 36 seconds  Comprehensive metabolic panel     Status: Abnormal   Collection Time: 09/12/14  4:06 PM  Result Value Ref Range   Sodium 145 135 - 145 mmol/L   Potassium 4.1 3.5 - 5.1 mmol/L   Chloride 120 (H) 101 - 111 mmol/L   CO2 20 (L) 22 - 32 mmol/L   Glucose, Bld 183 (H) 65 - 99 mg/dL   BUN 58 (H) 6 - 20 mg/dL   Creatinine, Ser 1.10 (H) 0.44 - 1.00 mg/dL   Calcium 8.4 (L) 8.9 - 10.3 mg/dL   Total Protein 6.2 (L) 6.5 - 8.1 g/dL   Albumin 3.3 (L) 3.5 - 5.0 g/dL   AST 78 (H) 15 - 41 U/L   ALT 53 14 - 54 U/L   Alkaline Phosphatase 98 38 - 126 U/L   Total Bilirubin 1.5 (H) 0.3 - 1.2 mg/dL   GFR calc non Af Amer 52 (L) >60 mL/min   GFR calc Af Amer >60 >60 mL/min    Comment: (NOTE) The eGFR has been calculated using the CKD EPI equation. This calculation has not been validated in all clinical situations. eGFR's persistently <60 mL/min signify possible Chronic Kidney Disease.    Anion gap 5 5 - 15  Glucose, capillary     Status: Abnormal   Collection Time: 09/12/14  4:36 PM  Result Value Ref Range   Glucose-Capillary 175 (H) 65 - 99 mg/dL  Type and screen     Status: None   Collection Time: 09/12/14  5:45 PM  Result Value Ref Range   ABO/RH(D) A POS    Antibody Screen POS    Sample Expiration 09/15/2014    Antibody Identification ANTI E ANTI K   Glucose, capillary     Status: Abnormal   Collection Time: 09/12/14  8:19 PM  Result Value Ref Range   Glucose-Capillary 205 (H) 65 - 99 mg/dL  CBC     Status: Abnormal   Collection Time: 09/13/14  1:40 AM  Result Value Ref Range   WBC 19.8 (H) 3.6 - 11.0 K/uL   RBC 3.23 (L) 3.80 - 5.20 MIL/uL   Hemoglobin 7.9 (L) 12.0 - 16.0 g/dL   HCT 25.6 (L) 35.0 - 47.0 %   MCV 79.2 (L) 80.0 - 100.0 fL   MCH 24.4 (L) 26.0 - 34.0 pg   MCHC 30.8 (L) 32.0 - 36.0 g/dL   RDW 17.6 (H) 11.5 - 14.5 %   Platelets 125 (L) 150 - 440 K/uL  Basic metabolic panel     Status:  Abnormal   Collection Time: 09/13/14  1:40 AM  Result Value Ref Range   Sodium 150 (H) 135 - 145 mmol/L   Potassium 4.4 3.5 - 5.1 mmol/L   Chloride 119 (H) 101 - 111 mmol/L   CO2 18 (L) 22 - 32 mmol/L   Glucose, Bld 141 (H) 65 - 99 mg/dL   BUN 67 (H) 6 - 20 mg/dL   Creatinine,  Ser 1.71 (H) 0.44 - 1.00 mg/dL   Calcium 8.3 (L) 8.9 - 10.3 mg/dL   GFR calc non Af Amer 31 (L) >60 mL/min   GFR calc Af Amer 36 (L) >60 mL/min    Comment: (NOTE) The eGFR has been calculated using the CKD EPI equation. This calculation has not been validated in all clinical situations. eGFR's persistently <60 mL/min signify possible Chronic Kidney Disease.    Anion gap 13 5 - 15  Magnesium     Status: Abnormal   Collection Time: 09/13/14  1:40 AM  Result Value Ref Range   Magnesium 2.6 (H) 1.7 - 2.4 mg/dL  Phosphorus     Status: Abnormal   Collection Time: 09/13/14  1:40 AM  Result Value Ref Range   Phosphorus 5.3 (H) 2.5 - 4.6 mg/dL  Ammonia     Status: Abnormal   Collection Time: 09/13/14  5:00 AM  Result Value Ref Range   Ammonia 570 (H) 9 - 35 umol/L  Glucose, capillary     Status: Abnormal   Collection Time: 09/13/14  5:13 AM  Result Value Ref Range   Glucose-Capillary 120 (H) 65 - 99 mg/dL  Protime-INR     Status: Abnormal   Collection Time: 09/13/14  6:05 AM  Result Value Ref Range   Prothrombin Time 23.7 (H) 11.4 - 15.0 seconds   INR 2.10   Glucose, capillary     Status: Abnormal   Collection Time: 09/13/14  8:08 AM  Result Value Ref Range   Glucose-Capillary 160 (H) 65 - 99 mg/dL  Hemoglobin     Status: Abnormal   Collection Time: 09/13/14 10:16 AM  Result Value Ref Range   Hemoglobin 8.5 (L) 12.0 - 16.0 g/dL  Blood gas, arterial     Status: Abnormal   Collection Time: 09/13/14 10:20 AM  Result Value Ref Range   FIO2 0.40 %   Delivery systems VENTILATOR    Mode PRESSURE REGULATED VOLUME CONTROL    VT 450 mL   Rate 30 resp/min   Peep/cpap 5.0 cm H20   pH, Arterial 7.21 (L)  7.350 - 7.450    Comment: RESULT CALLED TO, READ BACK BY AND VERIFIED WITH:  DR.KASA [id number]    pCO2 arterial 29 (L) 32.0 - 48.0 mmHg   pO2, Arterial 75 (L) 83.0 - 108.0 mmHg   Bicarbonate 11.6 (L) 21.0 - 28.0 mEq/L   Acid-base deficit 14.9 (H) 0.0 - 2.0 mmol/L    Comment: RESULT CALLED TO, READ BACK BY AND VERIFIED WITH: DR.KASA 1040    O2 Saturation 91.2 %   Patient temperature 37.0    Collection site LEFT RADIAL    Sample type ARTERIAL DRAW    Allens test (pass/fail) YES (A) PASS   Mechanical Rate 18     Dg Abd 1 View  09/12/2014   CLINICAL DATA:  Constipation  EXAM: ABDOMEN - 1 VIEW  COMPARISON:  None.  FINDINGS: Nonobstructive bowel gas pattern.  Normal colonic stool burden.  Visualized osseous structures are within normal limits.  IMPRESSION: Unremarkable abdominal radiograph.   Electronically Signed   By: Sriyesh  Krishnan M.D.   On: 09/12/2014 16:57   Ct Head Wo Contrast  09/12/2014   CLINICAL DATA:  Acute onset of seizure. Altered mental status. Initial encounter.  EXAM: CT HEAD WITHOUT CONTRAST  TECHNIQUE: Contiguous axial images were obtained from the base of the skull through the vertex without intravenous contrast.  COMPARISON:  None.  FINDINGS: There is no evidence of acute   infarction, mass lesion, or intra- or extra-axial hemorrhage on CT.  The posterior fossa, including the cerebellum, brainstem and fourth ventricle, is within normal limits. The third and lateral ventricles, and basal ganglia are unremarkable in appearance. The cerebral hemispheres are symmetric in appearance, with normal gray-white differentiation. No mass effect or midline shift is seen.  There is no evidence of fracture; visualized osseous structures are unremarkable in appearance. The orbits are within normal limits. The paranasal sinuses and mastoid air cells are well-aerated. No significant soft tissue abnormalities are seen.  IMPRESSION: Unremarkable noncontrast CT of the head.   Electronically Signed   By:  Jeffery  Chang M.D.   On: 09/12/2014 23:05   Us Abdomen Complete  09/12/2014   CLINICAL DATA:  Cirrhosis  EXAM: ULTRASOUND ABDOMEN COMPLETE  COMPARISON:  None.  FINDINGS: Gallbladder: No gallstones or pericholecystic fluid is noted. Mild wall thickening is noted in parts of the gallbladder are related to the underlying ascites.  Common bile duct: Diameter: Varies in size from 6-4 mm.  Liver: Diffuse increased echogenicity is noted with some nodularity consistent with the given clinical history of cirrhosis.  IVC: No abnormality visualized.  Pancreas: Visualized portion unremarkable.  Spleen: Size and appearance within normal limits.  Right Kidney: Length: 11.3 cm. Echogenicity within normal limits. No mass or hydronephrosis visualized.  Left Kidney: Length: 9.3 cm. Echogenicity within normal limits. No mass or hydronephrosis visualized.  Abdominal aorta: No aneurysm visualized.  Other findings: Mild ascites is noted.  IMPRESSION: Changes consistent with cirrhosis of the liver with mild ascites.  No other focal abnormality is noted.   Electronically Signed   By: Mark  Lukens M.D.   On: 09/12/2014 09:50   Dg Fluoro Rm 1-60 Min  09/13/2014   CLINICAL DATA:  GI bleeding. Cirrhosis. Elevated pneumonia. Need for enteric tube for medication  EXAM: FEEDING TUBE PLACEMENT IN FLUOROSCOPY  PROCEDURE: The patient was brought to Radiology. The patient was on a ventilator. Informed consent was obtained by the referring physician. Kangaroo feeding tube was placed through the left nares into the stomach without difficulty. Multiple attempts were made to enter at the duodenum but were unsuccessful. The stomach is distended with gas. The tube was left in the gastric antrum.  Complications: None  FINDINGS: Feeding tube tip in the gastric antrum  MEDICATIONS: None  IMPRESSION: Successful feeding tube placement with the tip in the gastric antrum.   Electronically Signed   By: Charles  Clark M.D.   On: 09/13/2014 08:20   Dg Chest  Port 1 View  09/12/2014   CLINICAL DATA:  Intubation  EXAM: PORTABLE CHEST - 1 VIEW  COMPARISON:  Portable exam 0958 hours compared to 09/11/2014  FINDINGS: Tip of endotracheal tube projects 3.1 cm above carina.  RIGHT arm PICC line tip projects over SVC.  Enlargement of cardiac silhouette with slight vascular congestion.  Mediastinal contours normal.  Atelectasis in the mid lungs bilaterally.  No gross infiltrate, pleural effusion or pneumothorax.  IMPRESSION: Satisfactory endotracheal tube position.  Subsegmental atelectasis in the mid lungs bilaterally.   Electronically Signed   By: Mark  Boles M.D.   On: 09/12/2014 10:05   Dg Chest Port 1 View  09/11/2014   CLINICAL DATA:  PICC readjustment  EXAM: PORTABLE CHEST - 1 VIEW  COMPARISON:  09/11/2014 at 1:20 p.m.  FINDINGS: Shortening of the right upper extremity PICC, tip near the upper cavoatrial junction.  Stable cardiomegaly and vascular pedicle widening.  Hypoventilation with few bandlike opacities, consistent with atelectasis.   No edema, effusion, or pneumothorax.  IMPRESSION: 1. Shortened right PICC, tip near the upper cavoatrial junction. 2. Cardiomegaly. 3. Hypoventilation and mild basilar atelectasis.   Electronically Signed   By: Monte Fantasia M.D.   On: 09/11/2014 14:19   Dg Chest Port 1 View  09/11/2014   CLINICAL DATA:  Status post PICC line placement  EXAM: PORTABLE CHEST - 1 VIEW  COMPARISON:  09/09/2014  FINDINGS: Cardiac shadow remains enlarged. A new right-sided PICC line is noted with the catheter tip at the superior right atrium. The lungs are well aerated with mild bibasilar scarring stable from the prior exam. No bony abnormality is noted.  IMPRESSION: Status post PICC line placement in the superior right atrium   Electronically Signed   By: Inez Catalina M.D.   On: 09/11/2014 13:58    Review of Systems  Unable to perform ROS: medical condition   Blood pressure 123/95, pulse 93, temperature 99.6 F (37.6 C), temperature source  Oral, resp. rate 32, height 5' 7" (1.702 m), weight 85.3 kg (188 lb 0.8 oz), SpO2 97 %. Physical Exam  Constitutional: She appears well-developed and well-nourished.  HENT:  Head: Normocephalic.  Mouth/Throat: Oropharynx is clear and moist.  Eyes: Conjunctivae and EOM are normal. No scleral icterus.  Neck: Normal range of motion. Neck supple.  Cardiovascular: Regular rhythm, normal heart sounds and intact distal pulses.   No murmur heard. Respiratory: Effort normal. She has no wheezes.  GI: Soft. She exhibits distension.  Neurological: She is unresponsive. No cranial nerve deficit. GCS eye subscore is 1. GCS verbal subscore is 1. GCS motor subscore is 1.  Nonresponsive, no response to pain, GCS 3T Pupils 17m B and NR, absent corneals, absent gag No response to pain   CT of head personally reviewed by me and shows mild loss of gray and white matter border  Labs reviewed by me  Assessment/Plan: 1.  Status epilepticus-  This is likely due to diffuse cerebral edema from liver failure.  These movements could also be myoclonus from liver failure as well.  Pt is at risk for brain death if these continue 2.  Mild cerebral edema-  Likely due to liver failure -  Increase Ativan to 861mhr in order to prevent seizure activity -  Continue Keppra 50049mID only due to renal failure too -  Continue lactulose and xifafin -  [facility] -  Pt may benefit from hypothermia -  Will follow closely  40 minutes of critical care time spent, examining pt, reviewing charts and coordinating care   Lorie Melichar 09/13/2014, 10:45 AM

## 2014-09-13 NOTE — Progress Notes (Signed)
   09/13/14 1020  Clinical Encounter Type  Visited With Patient and family together  Visit Type Follow-up  Referral From Chaplain  Consult/Referral To Chaplain  Spiritual Encounters  Spiritual Needs Emotional  Stress Factors  Family Stress Factors Health changes  Chaplain provided spiritual care and support to family. Contacted [facility] to alert [facility] that the family would like the support of a [employer]. Chaplain Zaryan Yakubov A. Leafy Motsinger Ext. [phone]

## 2014-09-13 NOTE — Consult Note (Signed)
  Events of last night noted. Poor response to lactulose via [place] or rectal enemas. Ammonia even higher. Now having seizures. On xifaxan bid as well. Slow drop in hgb. Being transfused. Will increase lactulose po to q 2hrs until pt can have multiple bowel movements. If there is a concern for active bleeding, can increase octreotide infusion to 50ug/hr. Currently on 25ug/hr. Watch for worsening of ascites with IVF. I will be off rest of rest of this week. Will have GI cover while I am gone. Thanks.

## 2014-09-14 LAB — PROLACTIN: PROLACTIN: 88.7 ng/mL — AB (ref 4.8–23.3)

## 2014-09-14 MED FILL — Medication: Qty: 1 | Status: AC

## 2014-09-18 ENCOUNTER — Encounter: Payer: Self-pay | Admitting: Gastroenterology

## 2014-09-22 DEATH — deceased

## 2016-06-13 IMAGING — CR DG CHEST 1V PORT
1 series · 1 of 1 positions shown · non-contrast
Comparison: 09/09/2014

CLINICAL DATA: Status post PICC line placement

EXAM:
PORTABLE CHEST - 1 VIEW

[ap]
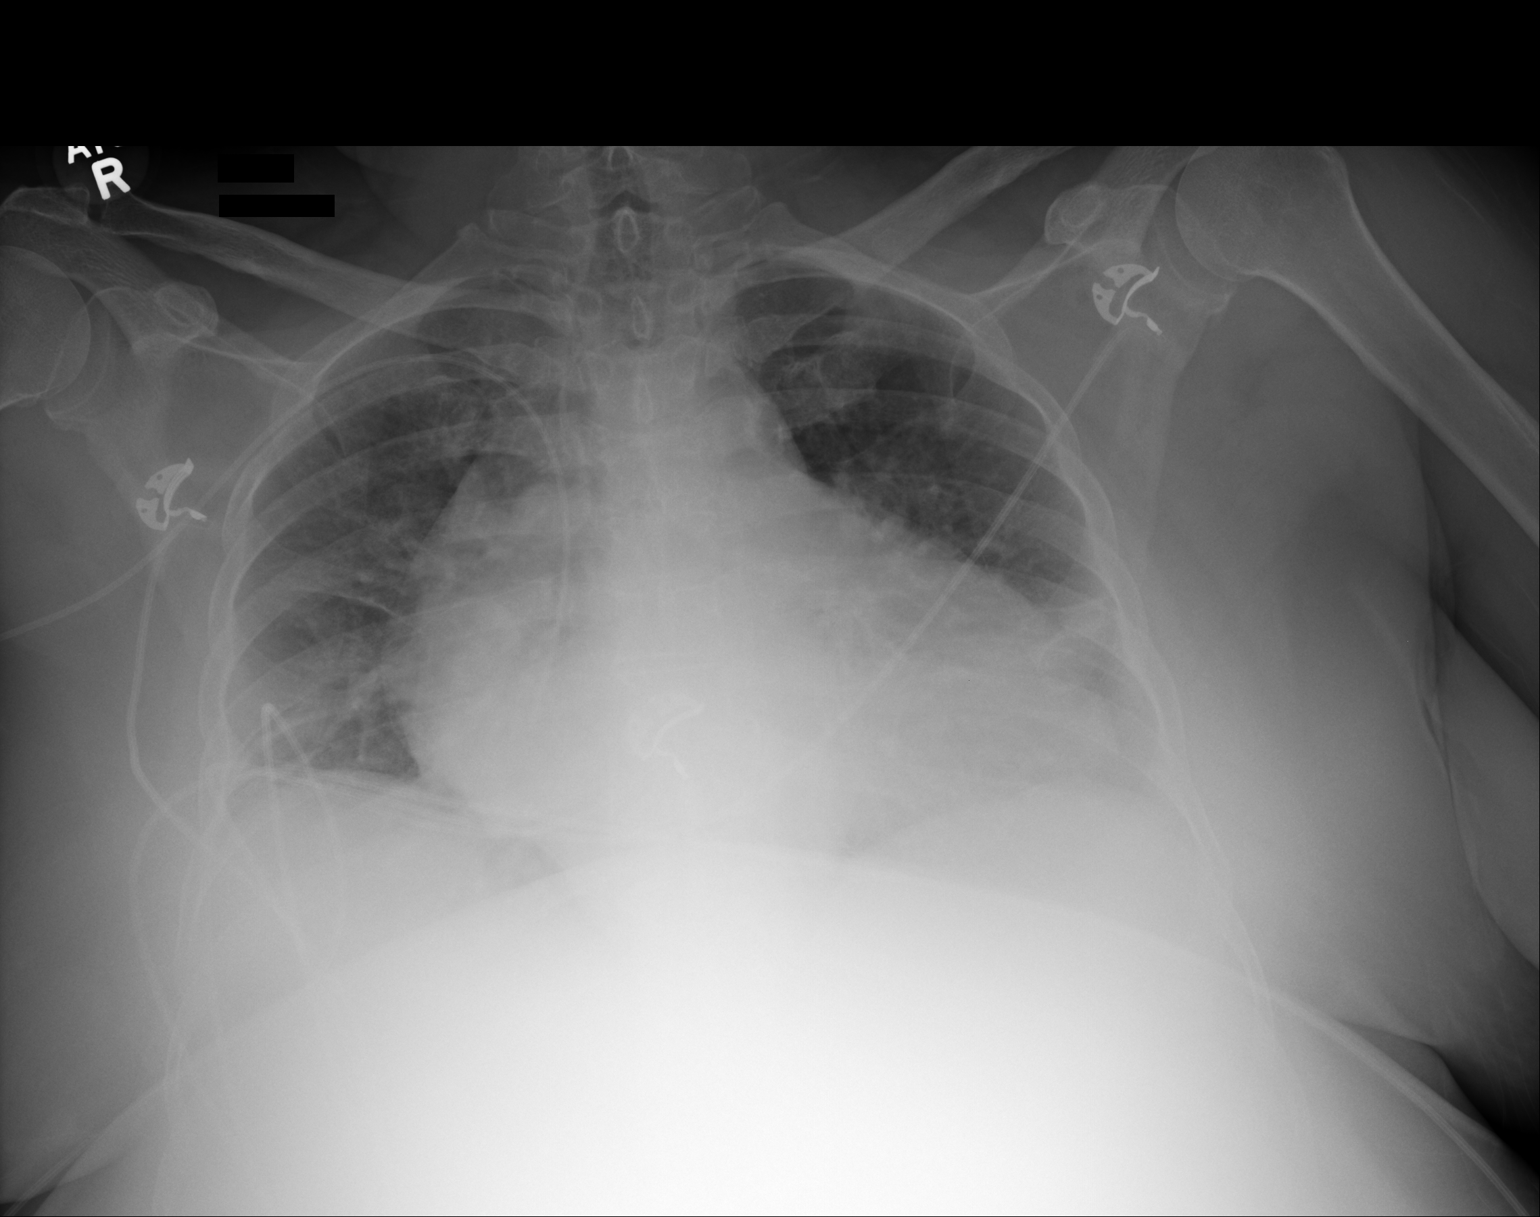

[1 of 1 positions shown; findings below may reference images not displayed]

FINDINGS: Cardiac shadow remains enlarged. A new right-sided PICC line is
noted with the catheter tip at the superior right atrium. The lungs
are well aerated with mild bibasilar scarring stable from the prior
exam. No bony abnormality is noted.
IMPRESSION: Status post PICC line placement in the superior right atrium

## 2016-06-14 IMAGING — CR DG CHEST 1V PORT
1 series · 1 of 1 positions shown · non-contrast
Comparison: Portable exam 1729 hours compared to 09/11/2014

CLINICAL DATA: Intubation

EXAM:
PORTABLE CHEST - 1 VIEW

[ap]
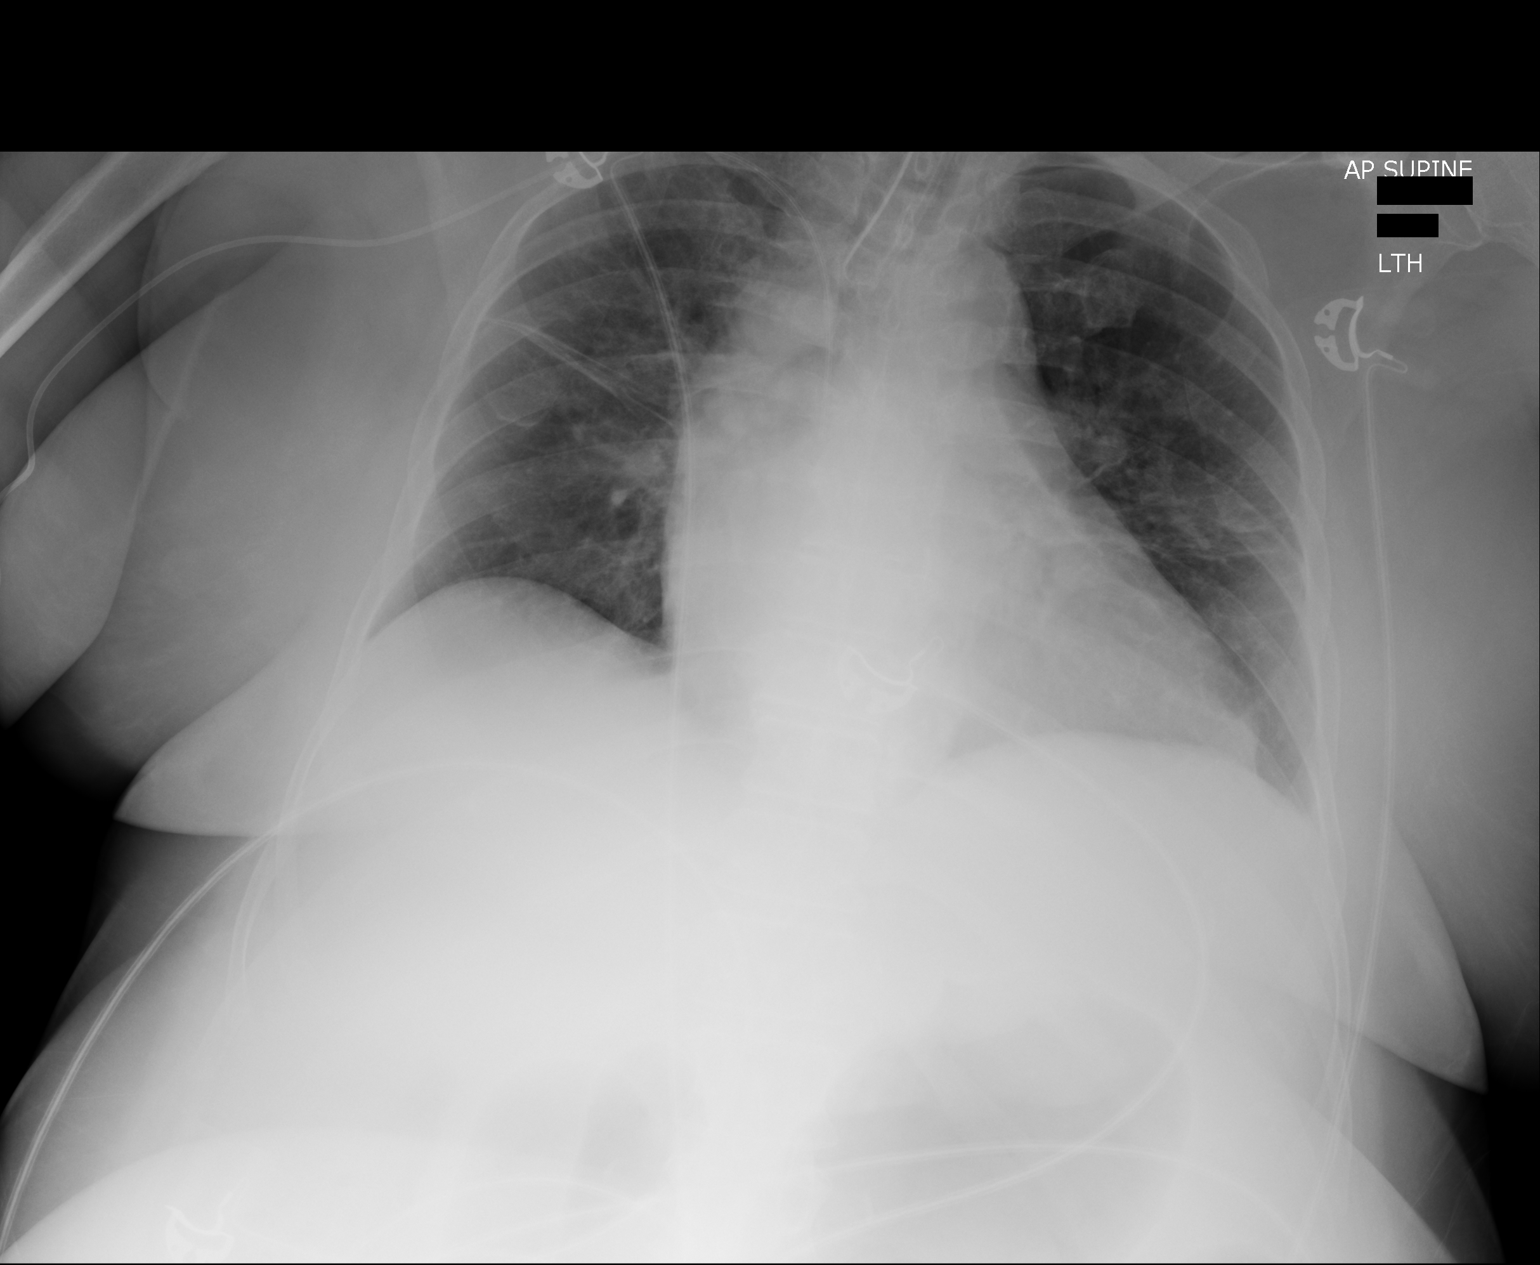

[1 of 1 positions shown; findings below may reference images not displayed]

FINDINGS: Tip of endotracheal tube projects 3.1 cm above carina.

RIGHT arm PICC line tip projects over SVC.

Enlargement of cardiac silhouette with slight vascular congestion.

Mediastinal contours normal.

Atelectasis in the mid lungs bilaterally.

No gross infiltrate, pleural effusion or pneumothorax.
IMPRESSION: Satisfactory endotracheal tube position.

Subsegmental atelectasis in the mid lungs bilaterally.

## 2016-06-14 IMAGING — RF DG FLUORO RM 1-60 MIN
1 series · 1 of 1 positions shown · non-contrast
Comparison: none

CLINICAL DATA: GI bleeding. Cirrhosis. Elevated pneumonia. Need for
enteric tube for medication

[Series 1: cp_standard · 0.17mm/px · 1 of 1 slices shown]
[im 1/1]
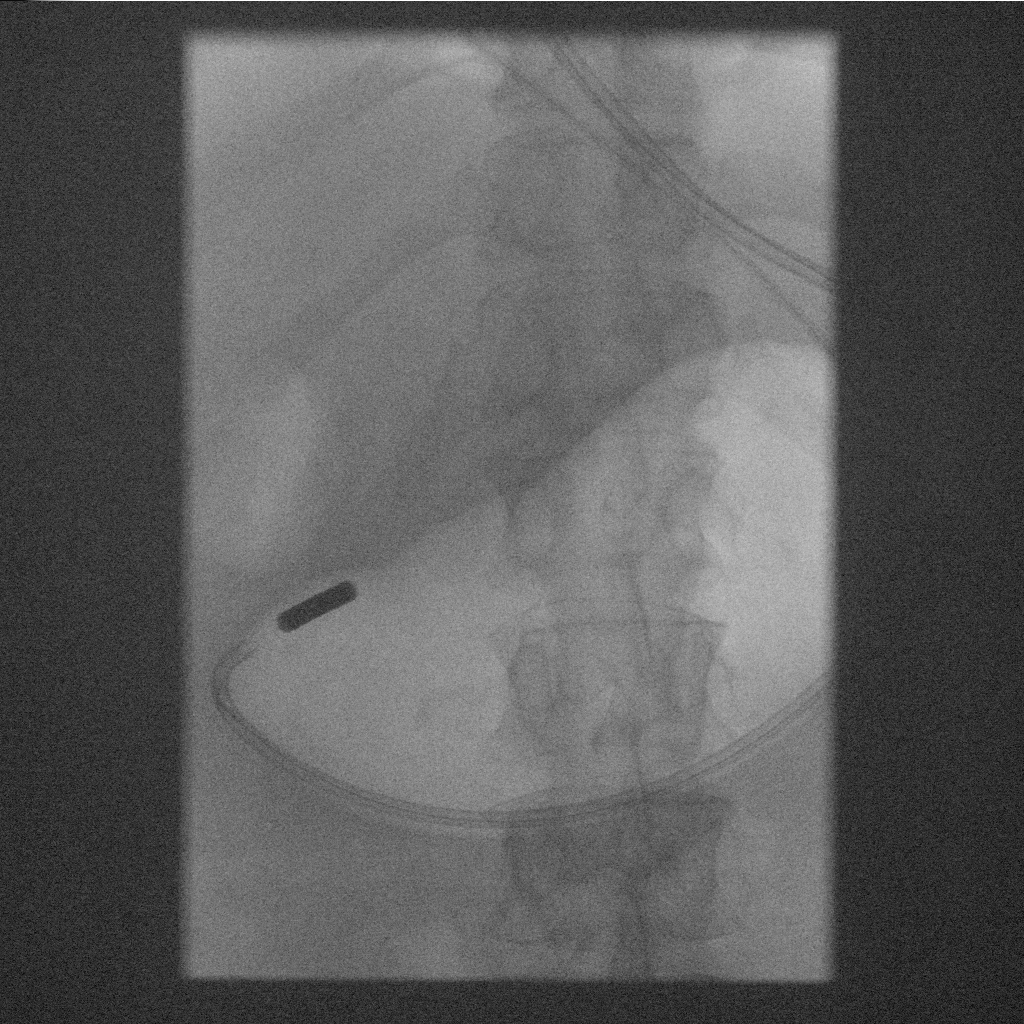

[1 of 1 positions shown; findings below may reference images not displayed]

EXAM:
FEEDING TUBE PLACEMENT IN FLUOROSCOPY

PROCEDURE:
The patient was brought to Radiology. The patient was on a
ventilator. Informed consent was obtained by the referring
physician. Kangaroo feeding tube was placed through the left nares
into the stomach without difficulty. Multiple attempts were made to
enter at the duodenum but were unsuccessful. The stomach is
distended with gas. The tube was left in the gastric antrum.

Complications: None
FINDINGS: Feeding tube tip in the gastric antrum

MEDICATIONS:
None
IMPRESSION: Successful feeding tube placement with the tip in the gastric
antrum.

## 2016-12-31 IMAGING — US US ABDOMEN COMPLETE
1 series · 14 of 25 positions shown · non-contrast
Comparison: None.

CLINICAL DATA: Cirrhosis

EXAM:
ULTRASOUND ABDOMEN COMPLETE

[Series 1: us abdomen complete · 0.20mm/px · 14 of 85 slices shown]
[im 1/85]
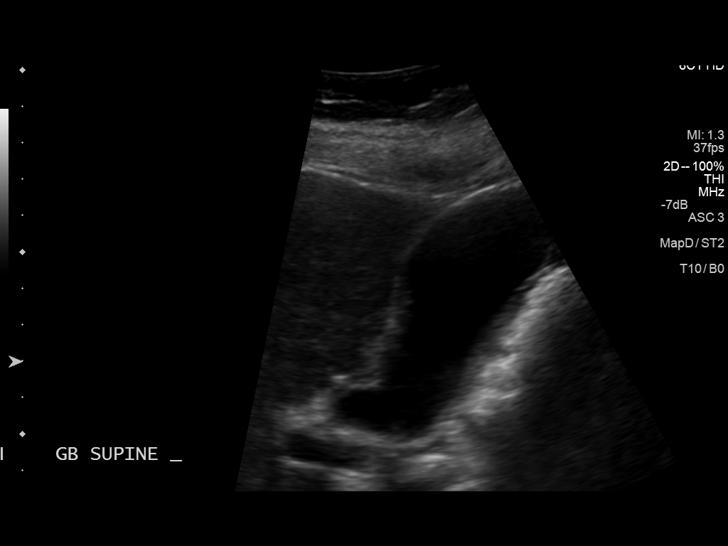
[im 8/85]
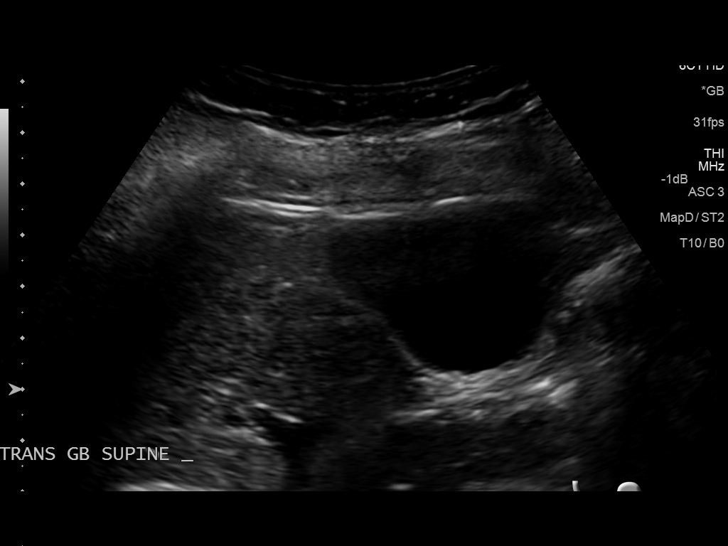
[im 15/85]
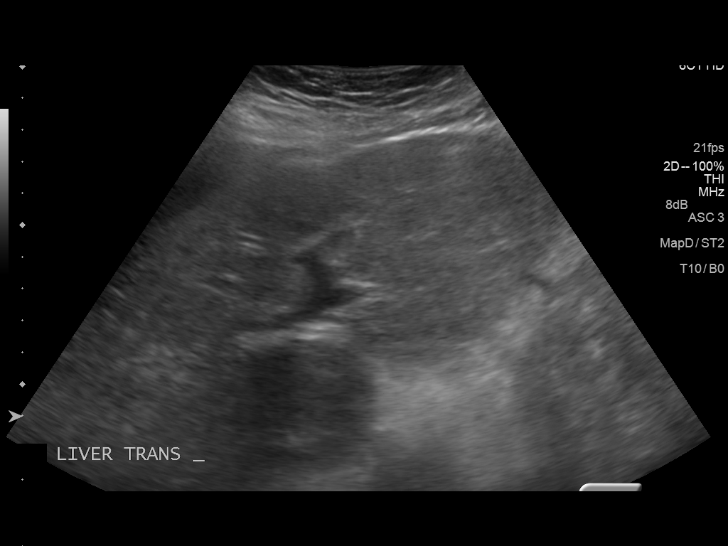
[im 22/85]
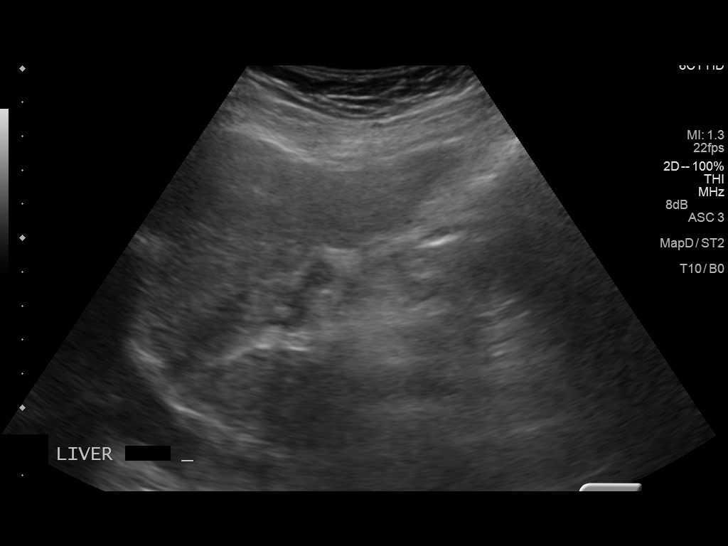
[im 29/85]
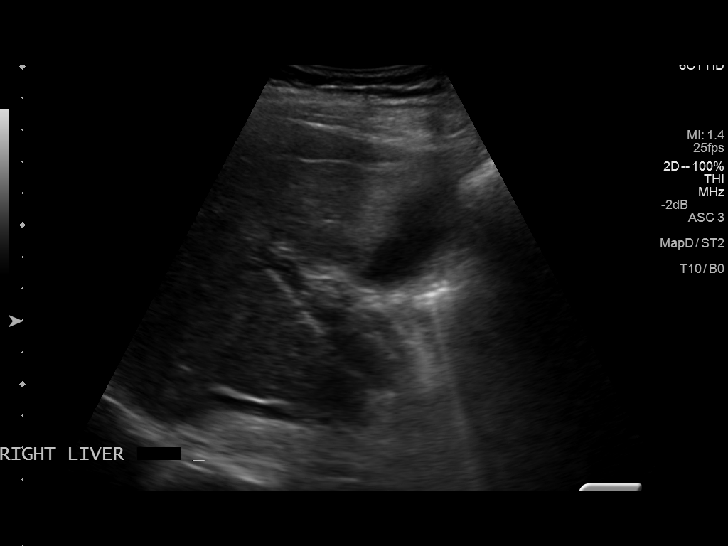
[im 32/85]
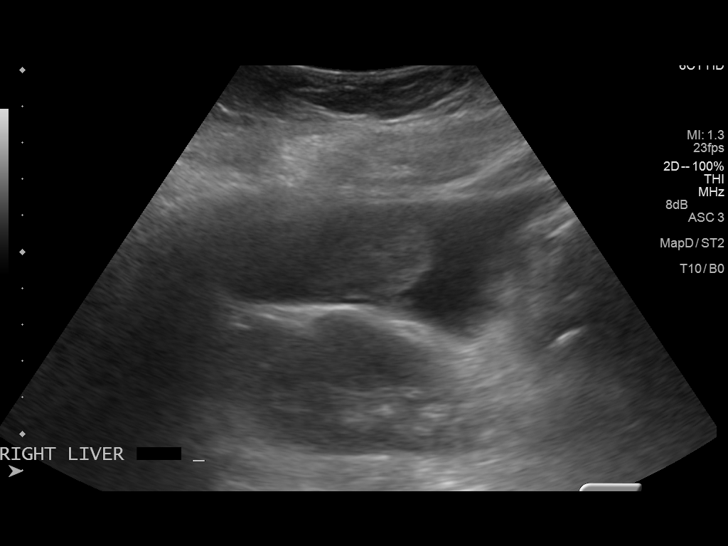
[im 39/85]
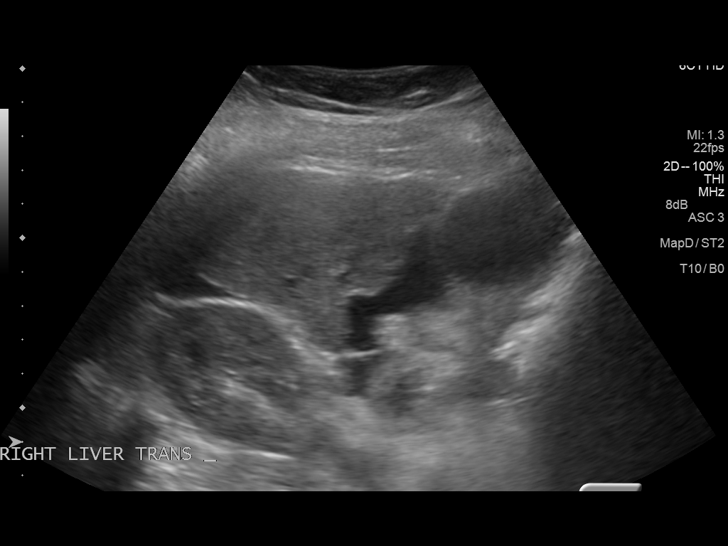
[im 46/85]
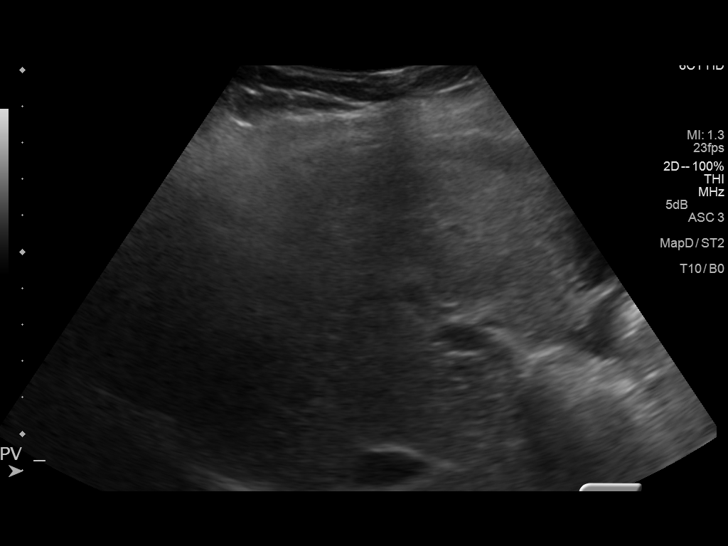
[im 53/85]
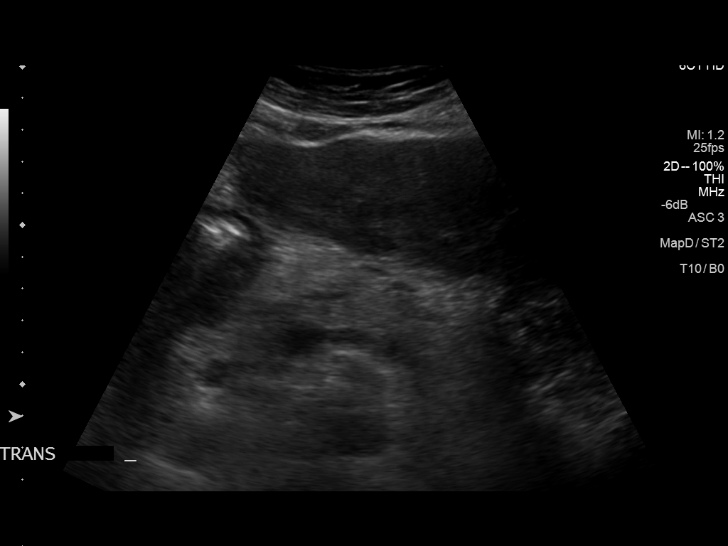
[im 57/85]
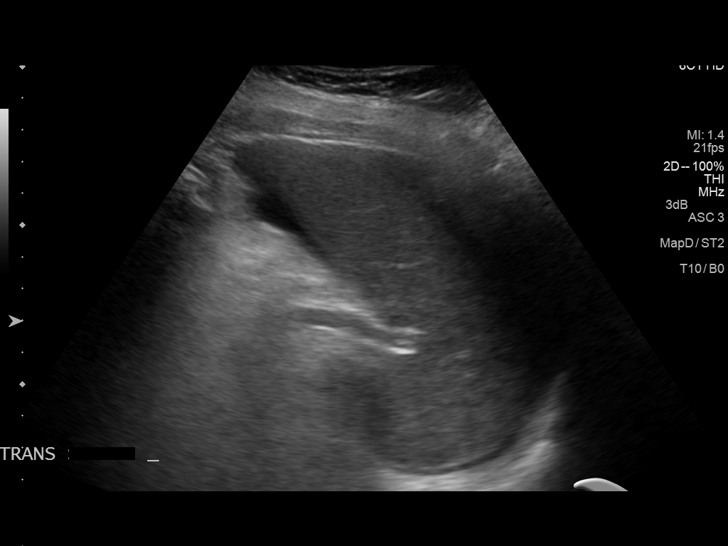
[im 64/85]
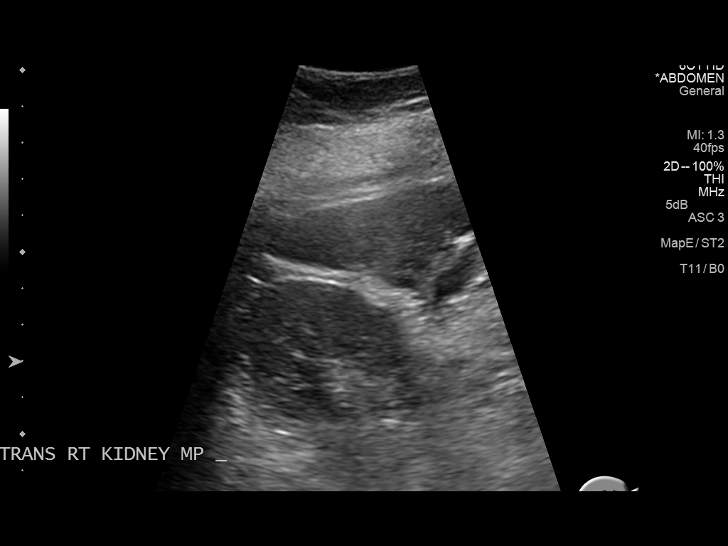
[im 71/85]
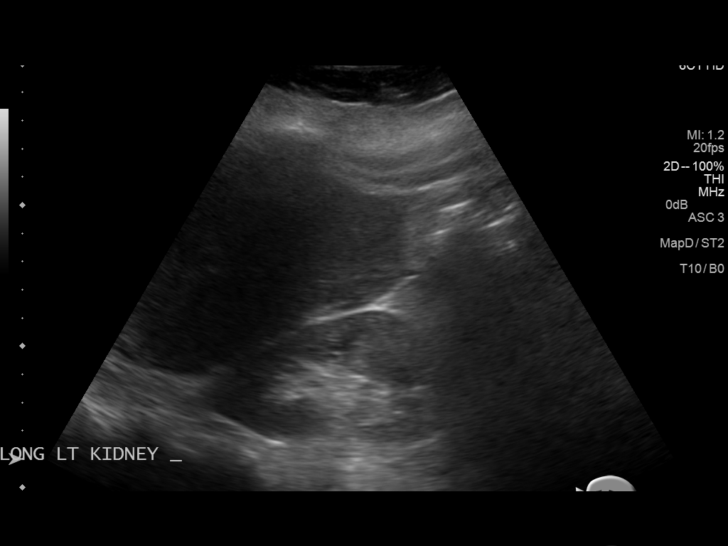
[im 78/85]
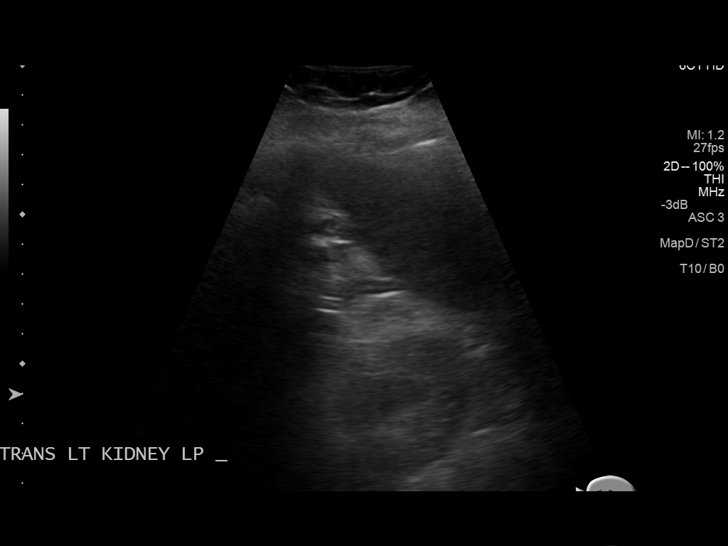
[im 85/85]
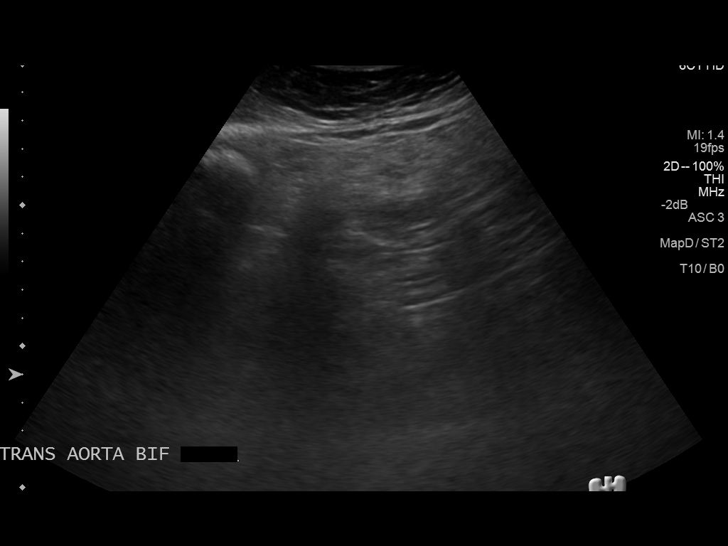

[14 of 25 positions shown; findings below may reference images not displayed]

FINDINGS: Gallbladder: No gallstones or pericholecystic fluid is noted. Mild
wall thickening is noted in parts of the gallbladder are related to
the underlying ascites.

Common bile duct: Diameter: Varies in size from 6-4 mm.

Liver: Diffuse increased echogenicity is noted with some nodularity
consistent with the given clinical history of cirrhosis.

IVC: No abnormality visualized.

Pancreas: Visualized portion unremarkable.

Spleen: Size and appearance within normal limits.

Right Kidney: Length: 11.3 cm.. Echogenicity within normal limits.
No mass or hydronephrosis visualized.

Left Kidney: Length: 9.3 cm.. Echogenicity within normal limits. No
mass or hydronephrosis visualized.

Abdominal aorta: No aneurysm visualized.

Other findings: Mild ascites is noted.
IMPRESSION: Changes consistent with cirrhosis of the liver with mild ascites.

No other focal abnormality is noted.
